# Patient Record
Sex: Male | Born: 1960 | Race: White | Hispanic: No | Smoking: Current every day smoker
Health system: Southern US, Community
[De-identification: ages and names within clinical notes are randomized; demographics above are authoritative.]

## PROBLEM LIST (undated history)

## (undated) DIAGNOSIS — Z7982 Long term (current) use of aspirin: Secondary | ICD-10-CM

## (undated) DIAGNOSIS — J45909 Unspecified asthma, uncomplicated: Secondary | ICD-10-CM

## (undated) DIAGNOSIS — F1010 Alcohol abuse, uncomplicated: Secondary | ICD-10-CM

## (undated) DIAGNOSIS — Z7902 Long term (current) use of antithrombotics/antiplatelets: Secondary | ICD-10-CM

## (undated) DIAGNOSIS — J449 Chronic obstructive pulmonary disease, unspecified: Secondary | ICD-10-CM

## (undated) DIAGNOSIS — I700 Atherosclerosis of aorta: Secondary | ICD-10-CM

## (undated) DIAGNOSIS — R0609 Other forms of dyspnea: Secondary | ICD-10-CM

## (undated) DIAGNOSIS — K219 Gastro-esophageal reflux disease without esophagitis: Secondary | ICD-10-CM

## (undated) DIAGNOSIS — I70219 Atherosclerosis of native arteries of extremities with intermittent claudication, unspecified extremity: Secondary | ICD-10-CM

## (undated) DIAGNOSIS — Z720 Tobacco use: Secondary | ICD-10-CM

## (undated) DIAGNOSIS — M47817 Spondylosis without myelopathy or radiculopathy, lumbosacral region: Secondary | ICD-10-CM

## (undated) DIAGNOSIS — Z955 Presence of coronary angioplasty implant and graft: Secondary | ICD-10-CM

## (undated) DIAGNOSIS — M1990 Unspecified osteoarthritis, unspecified site: Secondary | ICD-10-CM

## (undated) DIAGNOSIS — I2510 Atherosclerotic heart disease of native coronary artery without angina pectoris: Secondary | ICD-10-CM

## (undated) DIAGNOSIS — R918 Other nonspecific abnormal finding of lung field: Secondary | ICD-10-CM

## (undated) DIAGNOSIS — I10 Essential (primary) hypertension: Secondary | ICD-10-CM

## (undated) DIAGNOSIS — E785 Hyperlipidemia, unspecified: Secondary | ICD-10-CM

## (undated) HISTORY — PX: NO PAST SURGERIES: SHX2092

## (undated) HISTORY — DX: Chronic obstructive pulmonary disease, unspecified: J44.9

## (undated) HISTORY — DX: Essential (primary) hypertension: I10

## (undated) HISTORY — DX: Unspecified osteoarthritis, unspecified site: M19.90

---

## 2015-10-29 ENCOUNTER — Encounter (HOSPITAL_COMMUNITY): Payer: Self-pay | Admitting: *Deleted

## 2015-10-29 ENCOUNTER — Emergency Department (HOSPITAL_COMMUNITY): Payer: Self-pay

## 2015-10-29 ENCOUNTER — Emergency Department (HOSPITAL_COMMUNITY)
Admission: EM | Admit: 2015-10-29 | Discharge: 2015-10-29 | Disposition: A | Discharge status: Home / Self Care | Payer: Self-pay | Attending: Emergency Medicine | Admitting: Emergency Medicine

## 2015-10-29 DIAGNOSIS — F17210 Nicotine dependence, cigarettes, uncomplicated: Secondary | ICD-10-CM | POA: Insufficient documentation

## 2015-10-29 DIAGNOSIS — Z792 Long term (current) use of antibiotics: Secondary | ICD-10-CM | POA: Insufficient documentation

## 2015-10-29 DIAGNOSIS — R197 Diarrhea, unspecified: Secondary | ICD-10-CM | POA: Insufficient documentation

## 2015-10-29 DIAGNOSIS — J209 Acute bronchitis, unspecified: Secondary | ICD-10-CM | POA: Insufficient documentation

## 2015-10-29 DIAGNOSIS — R509 Fever, unspecified: Secondary | ICD-10-CM | POA: Insufficient documentation

## 2015-10-29 MED ORDER — ALBUTEROL SULFATE (2.5 MG/3ML) 0.083% IN NEBU
5.0000 mg | INHALATION_SOLUTION | Freq: Once | RESPIRATORY_TRACT | Status: AC
  Administered 2015-10-29: 5 mg via RESPIRATORY_TRACT
  Filled 2015-10-29: qty 6

## 2015-10-29 MED ORDER — IPRATROPIUM-ALBUTEROL 0.5-2.5 (3) MG/3ML IN SOLN
3.0000 mL | Freq: Once | RESPIRATORY_TRACT | Status: AC
  Administered 2015-10-29: 3 mL via RESPIRATORY_TRACT
  Filled 2015-10-29: qty 3

## 2015-10-29 MED ORDER — PREDNISONE 20 MG PO TABS: 40.0000 mg | ORAL_TABLET | Freq: Every day | ORAL | Status: DC

## 2015-10-29 MED ORDER — AZITHROMYCIN 250 MG PO TABS
250.0000 mg | ORAL_TABLET | Freq: Every day | ORAL | Status: DC
Start: 2015-10-29 — End: 2021-10-24

## 2015-10-29 MED ORDER — ALBUTEROL SULFATE HFA 108 (90 BASE) MCG/ACT IN AERS: 1.0000 | INHALATION_SPRAY | Freq: Four times a day (QID) | RESPIRATORY_TRACT | Status: DC | PRN

## 2015-10-29 MED ORDER — PREDNISONE 20 MG PO TABS
60.0000 mg | ORAL_TABLET | Freq: Once | ORAL | Status: AC
  Administered 2015-10-29: 60 mg via ORAL
  Filled 2015-10-29: qty 3

## 2015-10-29 NOTE — ED Notes (Signed)
Pt c/o productive cough onset x 1 wk, with yellow & green sputum, fever, chills, pt c/o SOB worse with ambulation, pt denies CP, n/v, pt reports x 2 episodes of diarrhea in the last 24 hrs

## 2015-10-29 NOTE — ED Provider Notes (Signed)
CSN: [id number]     Arrival date & time 10/29/15  1221 History   First MD Initiated Contact with Patient 10/29/15 1332     Chief Complaint  Patient presents with  . Cough  . Shortness of Breath     (Consider location/radiation/quality/duration/timing/severity/associated sxs/prior Treatment) Patient is a 55 y.o. male presenting with cough and shortness of breath. The history is provided by the patient and medical records. No language interpreter was used.  Cough Associated symptoms: chills, fever, shortness of breath and wheezing   Associated symptoms: no diaphoresis, no headaches, no myalgias, no rash, no rhinorrhea and no sore throat   Shortness of Breath Associated symptoms: cough, fever and wheezing   Associated symptoms: no abdominal pain, no diaphoresis, no headaches, no neck pain, no rash, no sore throat and no vomiting     Dominic Ferguson is a 55 y.o. male  who presents to the Emergency Department complaining of new onset productive cough with yellow sputum and shortness of breath x 3 days. - sob worse with exertion. + subjective fevers at home. Denies chest pain. Denies hx of lung disease. + smoker "a pack or so a day". Does not use oxygen at home. No medications taken PTA.   History reviewed. No pertinent past medical history. History reviewed. No pertinent past surgical history. No family history on file. Social History  Substance Use Topics  . Smoking status: Current Every Day Smoker -- 1.00 packs/day    Types: Cigarettes  . Smokeless tobacco: None  . Alcohol Use: 29.4 oz/week    49 Cans of beer per week    Review of Systems  Constitutional: Positive for fever and chills. Negative for diaphoresis and appetite change.  HENT: Negative for congestion, rhinorrhea and sore throat.   Eyes: Negative for visual disturbance.  Respiratory: Positive for cough, shortness of breath and wheezing.   Cardiovascular: Negative.   Gastrointestinal: Positive for diarrhea. Negative for  nausea, vomiting, abdominal pain and constipation.  Musculoskeletal: Negative for myalgias, back pain, arthralgias and neck pain.  Skin: Negative for rash.  Allergic/Immunologic: Negative for immunocompromised state.  Neurological: Negative for dizziness, weakness and headaches.      Allergies  Review of patient's allergies indicates no known allergies.  Home Medications   Prior to Admission medications   Medication Sig Start Date End Date Taking? Authorizing Provider  albuterol (PROVENTIL HFA;VENTOLIN HFA) 108 (90 Base) MCG/ACT inhaler Inhale 1-2 puffs into the lungs every 6 (six) hours as needed for wheezing or shortness of breath. 10/29/15   Saliha Salts Pilcher Jovi Zavadil, PA-C  aspirin-sod bicarb-citric acid (ALKA-SELTZER) 325 MG TBEF tablet Take 325 mg by mouth every 6 (six) hours as needed (cough).   Yes Historical Provider, MD  azithromycin (ZITHROMAX) 250 MG tablet Take 1 tablet (250 mg total) by mouth daily. Take first 2 tablets together, then 1 every day until finished. 10/29/15   Barlow Harrison Pilcher Itay Mella, PA-C  Phenyleph-Doxylamine-DM-APAP (NYQUIL SEVERE COLD/FLU) 5-6.25-10-325 MG/15ML LIQD Take 10 mLs by mouth every 6 (six) hours as needed (cough).   Yes Historical Provider, MD  predniSONE (DELTASONE) 20 MG tablet Take 2 tablets (40 mg total) by mouth daily. 10/29/15   Becca Bayne Pilcher Wadie Mattie, PA-C   BP 122/81 mmHg  Pulse 88  Temp(Src) 97.4 F (36.3 C) (Oral)  Resp 21  Ht 5\' 8"  (1.727 m)  Wt 79.379 kg  BMI 26.61 kg/m2  SpO2 90% Physical Exam  Constitutional: He is oriented to person, place, and time. He appears well-developed and well-nourished.  Alert and  in no acute distress  HENT:  Head: Normocephalic and atraumatic.  Cardiovascular: Normal rate, regular rhythm, normal heart sounds and intact distal pulses.  Exam reveals no gallop and no friction rub.   No murmur heard. Pulmonary/Chest: No respiratory distress. He exhibits no tenderness.  Bilateral expiratory wheezing, prolonged  expiratory phase  Abdominal: He exhibits no mass. There is no rebound and no guarding.  Abdomen soft, non-tender, non-distended  Musculoskeletal: He exhibits no edema.  Neurological: He is alert and oriented to person, place, and time.  Skin: Skin is warm and dry. No rash noted.  Psychiatric: He has a normal mood and affect. His behavior is normal. Judgment and thought content normal.  Nursing note and vitals reviewed.   ED Course  Procedures (including critical care time) Labs Review Labs Reviewed - No data to display  Imaging Review Dg Chest 2 View  10/29/2015  CLINICAL DATA:  Shortness of breath 1 week with cough and increased congestion past 3-4 days. Fever. EXAM: CHEST  2 VIEW COMPARISON:  None. FINDINGS: Lungs are adequately inflated without focal consolidation or effusion. There is mild prominence of the central bronchovascular markings. Flattening of the hemidiaphragms on the lateral film. Cardiomediastinal silhouette is within normal. There is calcified plaque over the thoracic aorta. There is mild degenerate change of the spine. IMPRESSION: Minimal prominence of the central bronchovascular markings which may be due to an acute viral bronchitic process versus chronic smoking related changes. Electronically Signed   By: Elberta Fortis M.D.   On: 10/29/2015 14:45   I have personally reviewed and evaluated these images and lab results as part of my medical decision-making.   EKG Interpretation   Date/Time:  Saturday October 29 2015 12:40:33 EST Ventricular Rate:  92 PR Interval:  150 QRS Duration: 96 QT Interval:  354 QTC Calculation: 437 R Axis:   72 Text Interpretation:  Normal sinus rhythm Right atrial enlargement No  previous tracing Confirmed by Anitra Lauth  MD, Alphonzo Lemmings ([id number]) on 10/29/2015  1:35:11 PM      MDM   Final diagnoses:  Acute bronchitis, unspecified organism   Dominic Ferguson presents with new onset productive cough, shortness of breath x 3 days. No hx of lung  disease, but 1ppd smoker   Imaging: CXR shows no consolidation or effusion Therapeutics:Alb. Neb tx, duoneb tx x 2, 60mg  prednisone   3:33 PM - Patient re-evaluated after 2nd neb treatment, lungs improved, but still wheezing. Patient requesting to leave. Had discussion at length that one more breathing treatment would be very beneficial. Patient agrees to stay for one more duoneb.   4:28 PM - Patient re-evaluated, feels much improved. Return precautions given.   A&P: Acute bronchitis  - Quit smoking  - Get PCP and follow up (resource guide given)  - Albuterol inhaler PRN cough, shortness of breath  - Azithromycin  - Prednisone steroid burst  Patient discussed with Dr. Anitra Lauth who agrees with treatment plan.  [facility] November Sypher, PA-C 10/29/15 1629  Gwyneth Sprout, MD 10/30/15 [phone]

## 2015-10-29 NOTE — Discharge Instructions (Signed)
1. Medications: albuterol inhaler as needed, azithromycin is your antibiotic - Please take all of your antibiotics until finished!, prednisone is your steroid, continue usual home medications 2. Treatment: rest, drink plenty of fluids 3. Follow Up: Please follow up with your primary doctor in 2-3 days for discussion of your diagnoses and further evaluation after today's visit; if you do not have a primary care doctor use the resource guide provided to find one; Please return to the ER for chest pain, shortness of breath, any new or worsening conditions, any additional concerns.    Emergency Department Resource Guide 1) Find a Doctor and Pay Out of Pocket Although you won't have to find out who is covered by your insurance plan, it is a good idea to ask around and get recommendations. You will then need to call the office and see if the doctor you have chosen will accept you as a new patient and what types of options they offer for patients who are self-pay. Some doctors offer discounts or will set up payment plans for their patients who do not have insurance, but you will need to ask so you aren't surprised when you get to your appointment.  2) Contact Your Local Health Department Not all health departments have doctors that can see patients for sick visits, but many do, so it is worth a call to see if yours does. If you don't know where your local health department is, you can check in your phone book. The CDC also has a tool to help you locate your state's health department, and many state websites also have listings of all of their local health departments.  3) Find a Walk-in Clinic If your illness is not likely to be very severe or complicated, you may want to try a walk in clinic. These are popping up all over the country in pharmacies, drugstores, and shopping centers. They're usually staffed by nurse practitioners or physician assistants that have been trained to treat common illnesses and  complaints. They're usually fairly quick and inexpensive. However, if you have serious medical issues or chronic medical problems, these are probably not your best option.  No Primary Care Doctor: - Call Health Connect at  [phone] - they can help you locate a primary care doctor that  accepts your insurance, provides certain services, etc. - Physician Referral Service- [phone]  Chronic Pain Problems: Organization         Address  Phone   Notes  [facility]  [phone] Patients need to be referred by their primary care doctor.   Medication Assistance: Organization         Address  Phone   Notes  [facility]ication [facility] [address] [place], [place] [address] [phone] --Must be a resident of [place] -- Must have NO insurance coverage whatsoever (no Medicaid/ Medicare, etc.) -- The pt. MUST have a primary care doctor that directs their care regularly and follows them in the community   MedAssist  [phone]   [employer]  [phone]    Agencies that provide inexpensive medical care: Organization         Address  Phone   Notes  [facility]  [phone]   [facility]    [phone]   [facility] [address] [place], [place] [address] [phone]   [facility] [place] [address], [place] [phone]  Planned Parenthood    [phone]   [facility]    [phone]   Community Health and [place] [address], [place] Phone:  [phone], Fax:  [phone] Hours of Operation:  9 am - 6 pm, M-F.  Also accepts Medicaid/Medicare and self-pay.  [facility] for [place], [address], [place] Phone: [phone], Fax: [phone]. Hours of Operation:  8:30 am - 5:30 pm, M-F.  Also accepts Medicaid and self-pay.  [facility] [place] [address], [place] Phone: [phone]   [place], [place], [place] [phone], Ext. [phone] Mondays & Thursdays: 7-9 AM.  First 15 patients are seen on a first come, first serve basis.    [place] Providers:  Organization         Address  Phone   Notes  [facility] [address], [place] [phone] Also accepts self-pay patients.  [facility] [address], [place]  [phone]   [place], [address], [place] [phone]   [facility] [address] [place] [phone]   Lucianne Lei [address], [place]   [phone] Only accepts [place] Access [place] patients after they have their name applied to their card.   Self-Pay (no insurance) in [facility]:  Organization         Address  Phone   Notes  Sickle Cell Patients, [facility] [place] [phone]   [facility] [place] [phone]   [facility] [place]  [place], [place], [place] [phone]   [facility]/Dr. Osei-Bonsu  [address], [place] or [address], [place] [phone] Phone number for both [place] and [place] locations is the same.  Urgent Medical and [facility] [address], [place] [phone]   [facility] [address], [place] or [address] [phone]   [facility] [address] [place] [phone], phone; [phone], fax Sees patients 1st and 3rd Saturday of every month.  Must not qualify for public or private insurance (i.e. Medicaid, Medicare, [place] Health Choice, Veterans' Benefits)  Household income should be no more than 200% of the poverty level  The clinic cannot treat you if you are pregnant or think you are pregnant  Sexually transmitted diseases are not treated at the clinic.    Dental Care: Organization         Address  Phone  Notes  [facility] Department of [facility] [phone] Accepts children up to age 26 who are enrolled in [place] or [place]; pregnant women with a Medicaid card; and children who have applied for Medicaid or [place] Health Choice, but were declined, whose parents can pay a reduced fee at time of service.  [facility] Department of [place] [facility]  [address], [place] [phone] Accepts children up to age 44 who are enrolled in [place] or [place]; pregnant women with a Medicaid card; and children who have applied for Medicaid or [place], but were declined,  whose parents can pay a reduced fee at time of service.  [facility] Adult Dental Access PROGRAM  [place] [phone] Patients are seen by appointment only. Walk-ins are not accepted. [place] will see patients 21 years of age and older. Monday - Tuesday (8am-5pm) Most Wednesdays (8:30-5pm) $30 per visit, cash only  [facility] Adult Dental Access PROGRAM  [address], [facility] [phone] Patients are seen by appointment only. Walk-ins are not accepted. [place] will see patients 70 years of age and older. One Wednesday Evening (Monthly: Volunteer Based).  $30 per visit, cash only  [place]  [phone] for adults; Children under age 28, call Graduate Pediatric Dentistry at [phone]. Children aged 8-14, please call [phone] to request a pediatric application.  Dental services are provided in all areas of dental care including fillings, crowns and bridges, complete and partial dentures, implants, gum treatment, root canals, and extractions. Preventive care is  also provided. Treatment is provided to both adults and children. Patients are selected via a lottery and there is often a waiting list.   [facility] [address] [place]  [phone] [email or web address]   [facility] [address] [place], [place] [phone], Ext. [phone] Second and Fourth Thursday of each month, opens at 6:30 AM; Clinic ends at 9 AM.  Patients are seen on a first-come first-served basis, and a limited number are seen during each clinic.   [facility]  [address] Hillard Danker [place], [place] [phone]   Eligibility Requirements You must have lived in [place], [place], or [place] counties for at least the last three months.   You cannot be eligible for state or federal sponsored [employer], including [employer], [place], or [employer].   You generally cannot be eligible for healthcare insurance through your employer.    How to apply: Eligibility screenings are held every Tuesday and Wednesday afternoon from 1:00 pm until 4:00 pm. You do not need an appointment for the interview!  [facility] [address], [place], [place]   [place]  [place] Department  [place]  [phone]    Behavioral Health Resources in the Community: Intensive Outpatient Programs Organization         Address  Phone  Notes  [place]. [address], [place], [place] [phone]   [facility] Outpatient [address] [place], [place]   ADS: Alcohol & Drug Svcs [address] [place], [place]   [place] [address]  [place], [place] or [phone]   Substance Abuse Resources Organization         Address  Phone  Notes  Alcohol and Drug Services  [phone]   [place]  [phone]   [facility] [place]   Chinita Pester  [phone]   Residential & Outpatient Substance Abuse Program  [phone]   Psychological Services Organization         Address  Phone  Notes  [facility] [place]  [place]  [phone]   [place] [address] [place] or [phone]    Mobile Crisis Teams Organization         Address  Phone  Notes  Therapeutic Alternatives, Mobile  Crisis Care Unit  [phone]   Assertive Psychotherapeutic Services  [address] [place], [place]   [facility] [address], [place] [phone]    Self-Help/Support Groups Organization         Address  Phone             Notes  [place]. of [place] - variety of support groups  [place] Call for more information  Narcotics Anonymous (NA), [facility]aring [facility] [address], [employer] [place]  2 meetings at this location   Special educational needs teacher         Address  Phone  Notes  ASAP Residential Treatment [place],    [place]  [phone]   [facility]  [address], [place] [address]191, [place], [place]   [place], [place] [phone] Admissions: 8am-3pm M-F  Incentives Substance [place] [address]    [place], [place] [phone]   [facility] [address] [place], [place], [place]   [facility] [address]  [place], [place]   [facility]ight Programs - Intensive Outpatient [address], [place], [place]   [facility].,  [place], [place] [phone] or [phone]   [facility]idential Treatment Services (RTS) [address] [place], [place] Accepts Medicaid  [facility] [address],  [place] [phone]  Substance Abuse/Addiction Treatment   [address] [facility] Organization         Address  Phone  Notes  [facility]  [phone]   Domenic Schwab, PhD [address] Arlis Porta [place], [place]   [phone] or [phone]   [place], [place] [phone]   [facility] [address], [place], [place] [phone] Insurance/Medicaid/sponsorship through [facility] and Families [address] [place]                                    [place], [place] [phone] [place] [address], [place] [phone]    Dr. Adele Schilder  [phone]   [facility] [place] Dept. 1) [address] [place] 2) [place] 3)  [place] [address], [place] [phone]  [phone]   [place] [phone] or [phone] (After Hours)

## 2015-10-29 NOTE — ED Notes (Signed)
Pt verbalized understanding of d/c instructions, prescriptions, and follow-up care. No further questions/concerns, VSS, ambulatory w/ steady gait (refused wheelchair) 

## 2016-11-30 IMAGING — DX DG CHEST 2V
2 series · 2 of 2 positions shown · non-contrast
Comparison: None.

CLINICAL DATA: Shortness of breath 1 week with cough and increased
congestion past 3-4 days. Fever.

EXAM:
CHEST  2 VIEW

[chest pa]
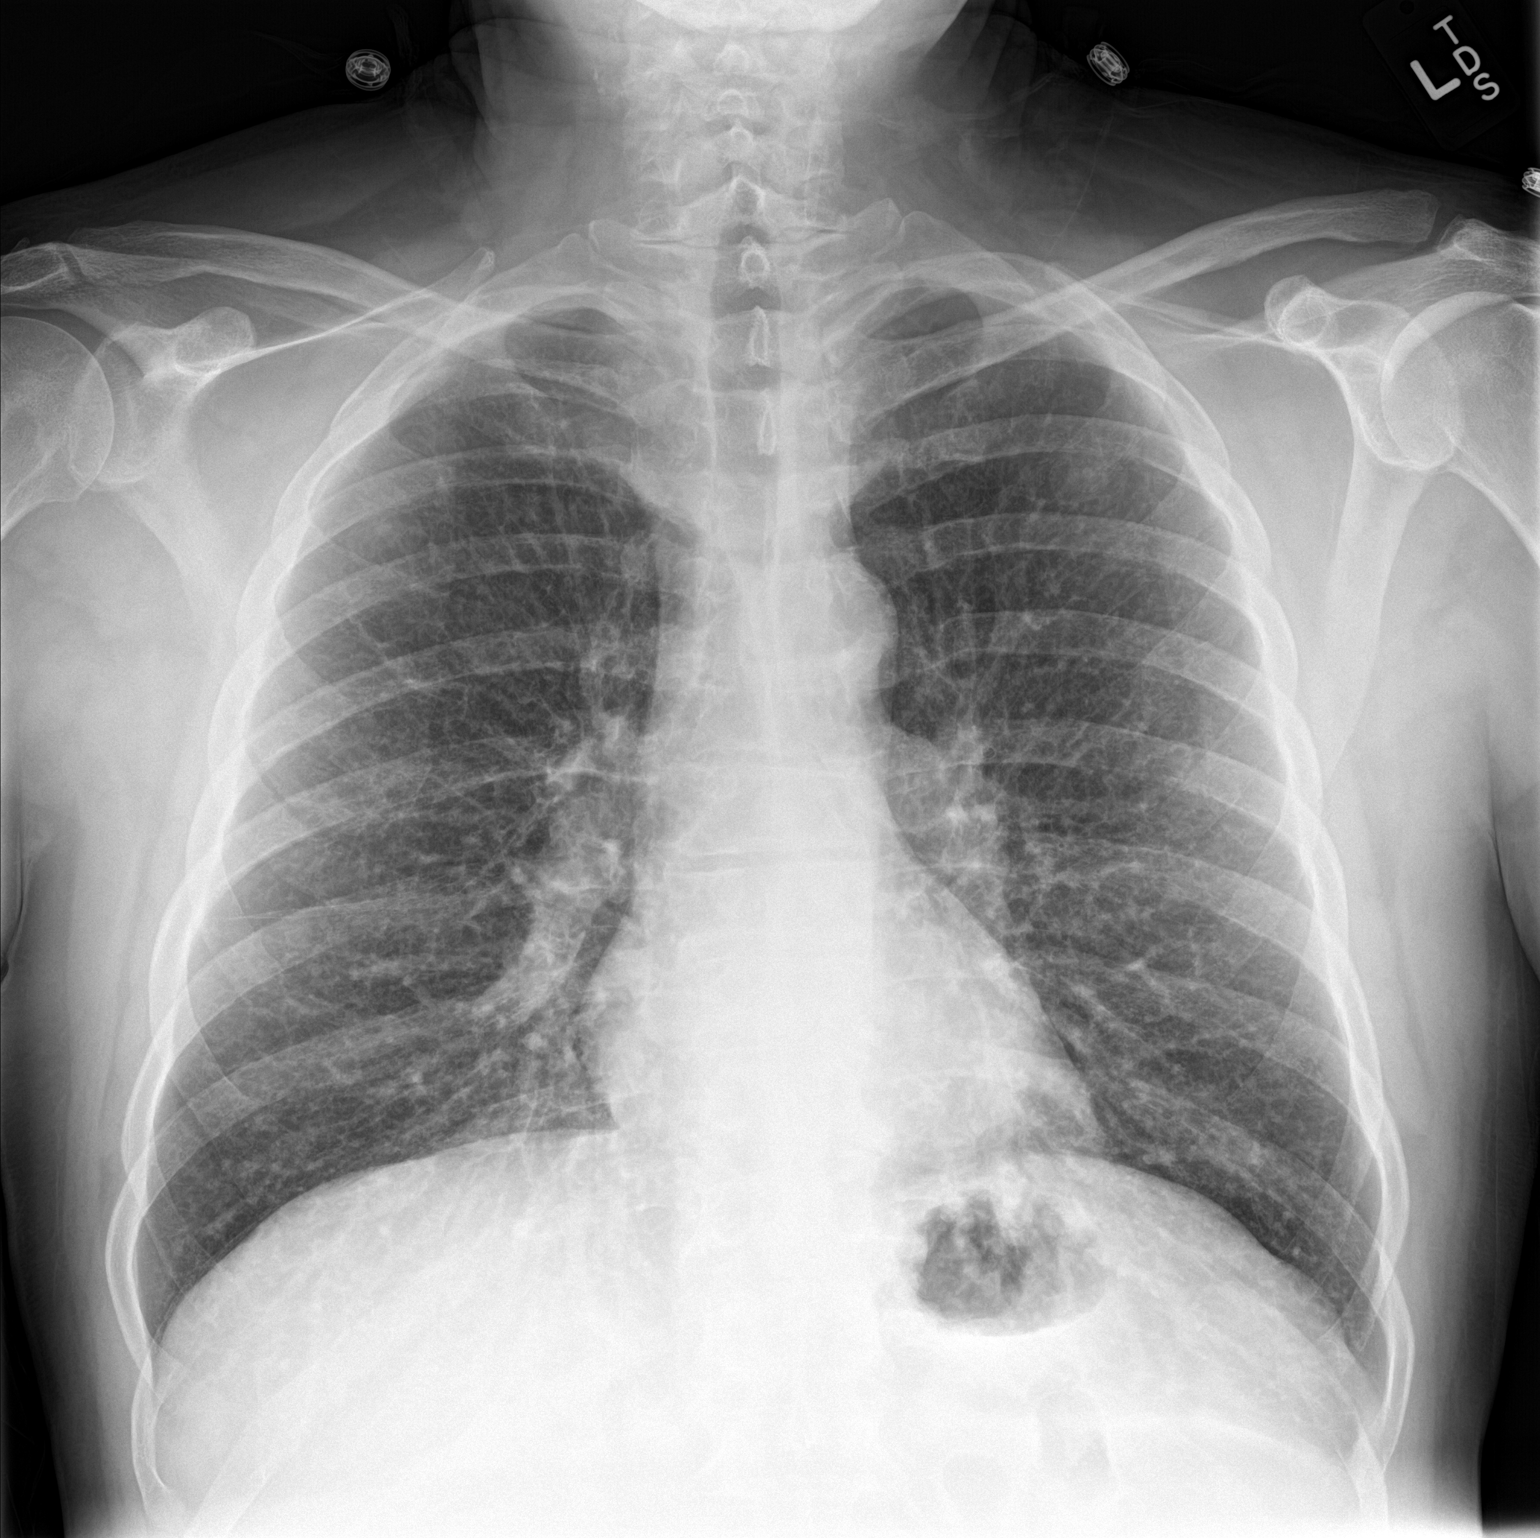

[chest lat]
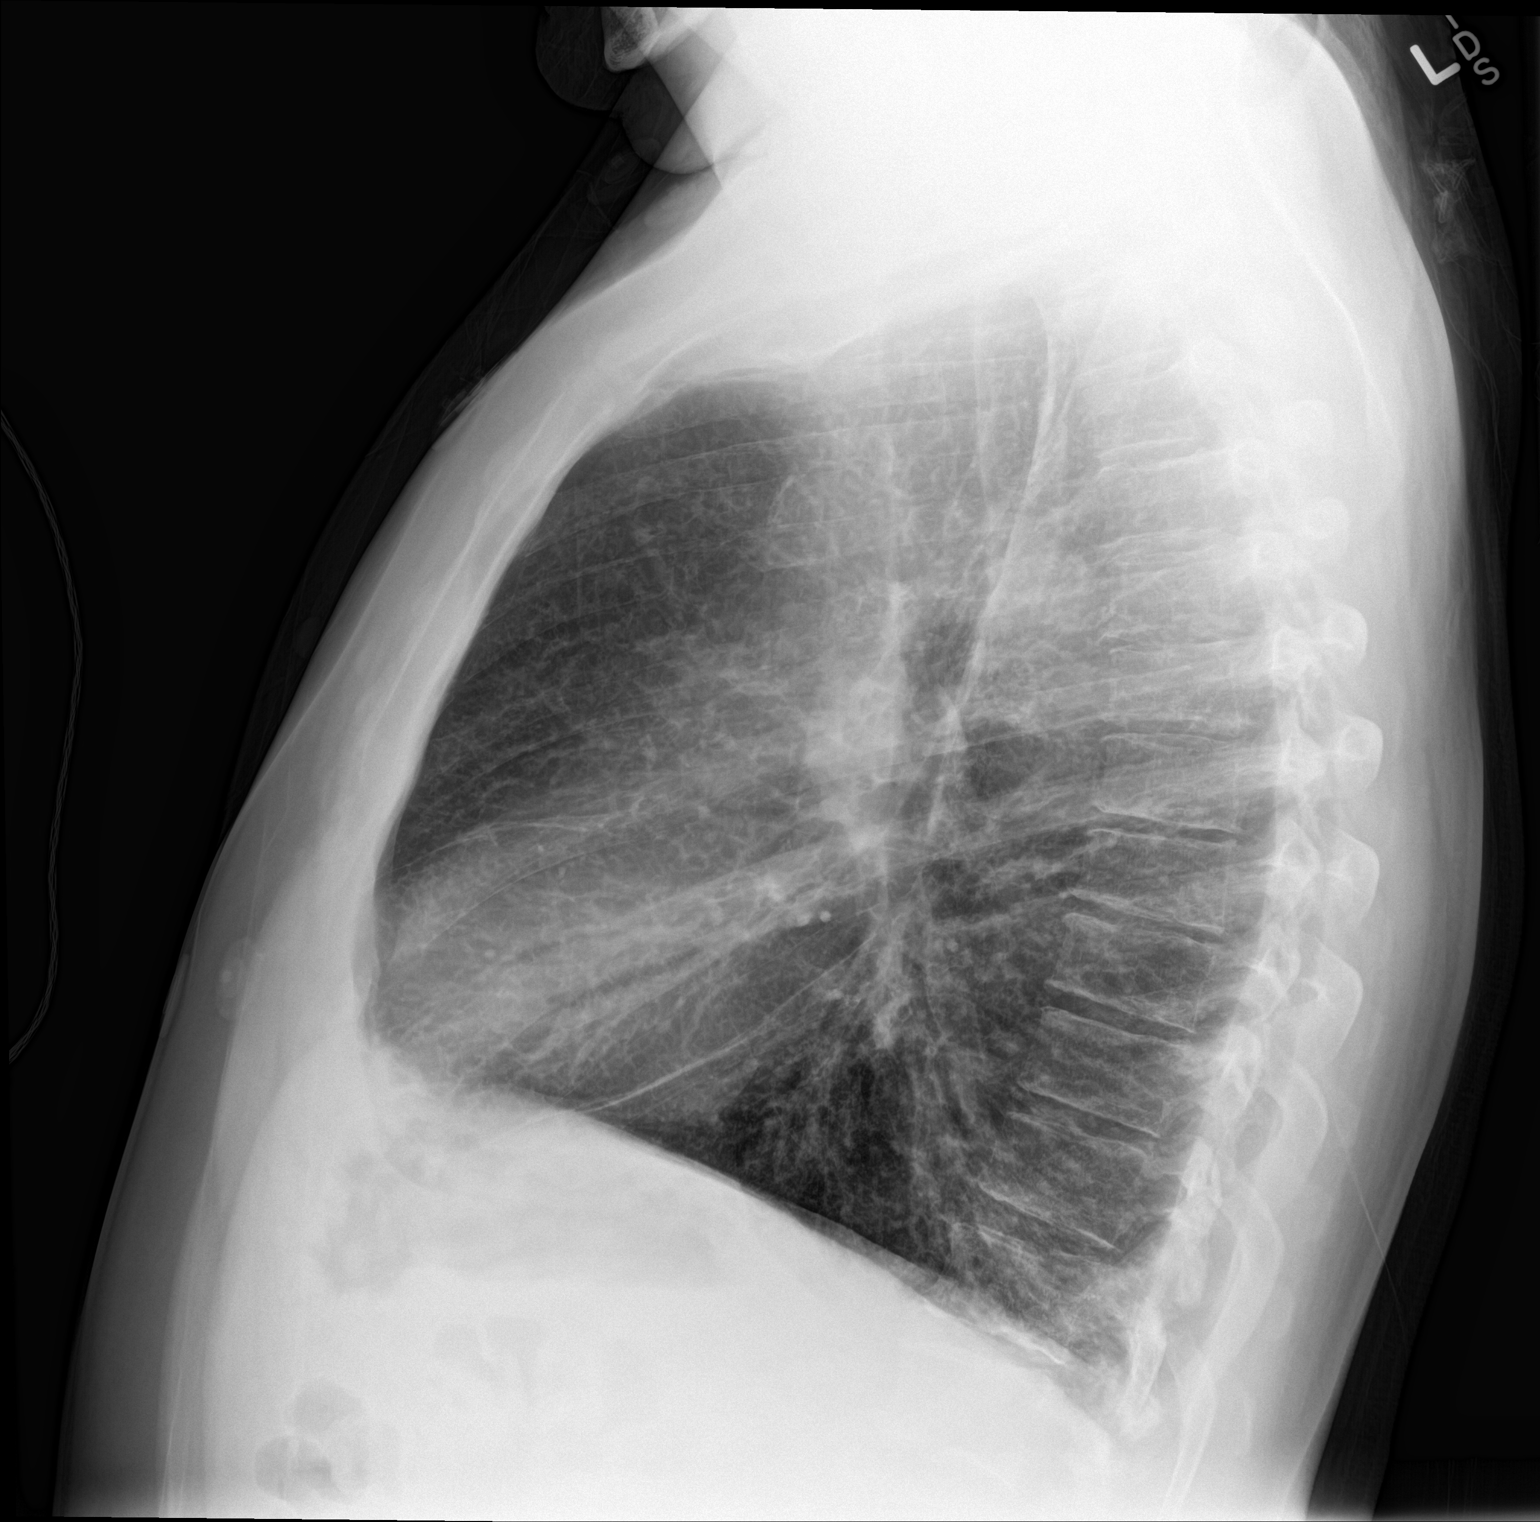

[2 of 2 positions shown; findings below may reference images not displayed]

FINDINGS: Lungs are adequately inflated without focal consolidation or
effusion. There is mild prominence of the central bronchovascular
markings. Flattening of the hemidiaphragms on the lateral film.
Cardiomediastinal silhouette is within normal. There is calcified
plaque over the thoracic aorta. There is mild degenerate change of
the spine.
IMPRESSION: Minimal prominence of the central bronchovascular markings which may
be due to an acute viral bronchitic process versus chronic smoking
related changes.

## 2018-04-07 ENCOUNTER — Emergency Department (HOSPITAL_COMMUNITY)
Admission: EM | Admit: 2018-04-07 | Discharge: 2018-04-07 | Disposition: A | Discharge status: Home / Self Care | Payer: Self-pay | Attending: Emergency Medicine | Admitting: Emergency Medicine

## 2018-04-07 ENCOUNTER — Encounter (HOSPITAL_COMMUNITY): Payer: Self-pay | Admitting: Emergency Medicine

## 2018-04-07 ENCOUNTER — Other Ambulatory Visit: Payer: Self-pay

## 2018-04-07 DIAGNOSIS — M545 Low back pain, unspecified: Secondary | ICD-10-CM

## 2018-04-07 DIAGNOSIS — Z79899 Other long term (current) drug therapy: Secondary | ICD-10-CM | POA: Insufficient documentation

## 2018-04-07 DIAGNOSIS — F17210 Nicotine dependence, cigarettes, uncomplicated: Secondary | ICD-10-CM | POA: Insufficient documentation

## 2018-04-07 MED ORDER — ACETAMINOPHEN 500 MG PO TABS
1000.0000 mg | ORAL_TABLET | Freq: Once | ORAL | Status: AC
  Administered 2018-04-07: 1000 mg via ORAL
  Filled 2018-04-07: qty 2

## 2018-04-07 MED ORDER — KETOROLAC TROMETHAMINE 60 MG/2ML IM SOLN
15.0000 mg | Freq: Once | INTRAMUSCULAR | Status: AC
  Administered 2018-04-07: 15 mg via INTRAMUSCULAR
  Filled 2018-04-07: qty 2

## 2018-04-07 MED ORDER — OXYCODONE HCL 5 MG PO TABS
5.0000 mg | ORAL_TABLET | Freq: Once | ORAL | Status: AC
  Administered 2018-04-07: 5 mg via ORAL
  Filled 2018-04-07: qty 1

## 2018-04-07 MED ORDER — DIAZEPAM 2 MG PO TABS
2.0000 mg | ORAL_TABLET | Freq: Once | ORAL | Status: DC
Start: 2018-04-07 — End: 2018-04-08
  Filled 2018-04-07: qty 1

## 2018-04-07 NOTE — Discharge Instructions (Signed)
Take 4 over the counter ibuprofen tablets 3 times a day or 2 over-the-counter naproxen tablets twice a day for pain. Also take tylenol 1000mg(2 extra strength) four times a day.    

## 2018-04-07 NOTE — ED Triage Notes (Signed)
Patient to ED c/o lower back pain x 6 days - unknown if certain injury or strenuous work caused it. Reports pain is worse with movement. Denies urinary symptoms or incontinence. No numbness/tingling. Ambulatory with steady gait, full ROM all extremities.

## 2018-04-07 NOTE — ED Provider Notes (Signed)
[facility] EMERGENCY DEPARTMENT Provider Note   CSN: [id number] Arrival date & time: 04/07/18  1805     History   Chief Complaint Chief Complaint  Patient presents with  . Back Pain    HPI Dominic Ferguson is a 57 y.o. male.  57 yo M with L sided low back pain.  Going on for past two days.  Thought kidney infection but not improvement with cranberry juice.  Worse with positions. Denies leg weakness, numbness, loss bowel or bladder.    The history is provided by the patient.  Back Pain   This is a new problem. The current episode started 2 days ago. The problem occurs constantly. The problem has been gradually worsening. The pain is present in the lumbar spine. The quality of the pain is described as stabbing and shooting. The pain does not radiate. The pain is at a severity of 7/10. The pain is moderate. The symptoms are aggravated by bending and twisting. Pertinent negatives include no chest pain, no fever, no headaches and no abdominal pain. He has tried bed rest for the symptoms. The treatment provided no relief.    History reviewed. No pertinent past medical history.  There are no active problems to display for this patient.   History reviewed. No pertinent surgical history.      Home Medications    Prior to Admission medications   Medication Sig Start Date End Date Taking? Authorizing Provider  albuterol (PROVENTIL HFA;VENTOLIN HFA) 108 (90 Base) MCG/ACT inhaler Inhale 1-2 puffs into the lungs every 6 (six) hours as needed for wheezing or shortness of breath. Patient not taking: Reported on 04/07/2018 10/29/15   Ward, Chase Picket, PA-C  azithromycin (ZITHROMAX) 250 MG tablet Take 1 tablet (250 mg total) by mouth daily. Take first 2 tablets together, then 1 every day until finished. Patient not taking: Reported on 04/07/2018 10/29/15   Ward, Chase Picket, PA-C  predniSONE (DELTASONE) 20 MG tablet Take 2 tablets (40 mg total) by mouth daily. Patient not  taking: Reported on 04/07/2018 10/29/15   Ward, Chase Picket, PA-C    Family History No family history on file.  Social History Social History   Tobacco Use  . Smoking status: Current Every Day Smoker    Packs/day: 1.00    Types: Cigarettes  . Smokeless tobacco: Never Used  Substance Use Topics  . Alcohol use: Yes    Alcohol/week: 29.4 oz    Types: 49 Cans of beer per week  . Drug use: Never     Allergies   Patient has no known allergies.   Review of Systems Review of Systems  Constitutional: Negative for chills and fever.  HENT: Negative for congestion and facial swelling.   Eyes: Negative for discharge and visual disturbance.  Respiratory: Negative for shortness of breath.   Cardiovascular: Negative for chest pain and palpitations.  Gastrointestinal: Negative for abdominal pain, diarrhea and vomiting.  Musculoskeletal: Positive for back pain. Negative for arthralgias and myalgias.  Skin: Negative for color change and rash.  Neurological: Negative for tremors, syncope and headaches.  Psychiatric/Behavioral: Negative for confusion and dysphoric mood.     Physical Exam Updated Vital Signs BP (!) 159/88 (BP Location: Right Arm)   Pulse 80   Temp 98.1 F (36.7 C) (Oral)   Resp 17   Ht 5\' 8"  (1.727 m)   Wt 80.3 kg (177 lb)   SpO2 98%   BMI 26.91 kg/m   Physical Exam  Constitutional: He is oriented  to person, place, and time. He appears well-developed and well-nourished.  HENT:  Head: Normocephalic and atraumatic.  Eyes: Pupils are equal, round, and reactive to light. EOM are normal.  Neck: Normal range of motion. Neck supple. No JVD present.  Cardiovascular: Normal rate and regular rhythm. Exam reveals no gallop and no friction rub.  No murmur heard. Pulmonary/Chest: No respiratory distress. He has no wheezes.  Abdominal: He exhibits no distension and no mass. There is no tenderness. There is no rebound and no guarding.  Musculoskeletal: Normal range of  motion. He exhibits tenderness.  Mild tenderness to palpation about the left paraspinal musculature about L3-4-5.  Ambulatory.  Pulse motor and sensation intact distally.  Neurological: He is alert and oriented to person, place, and time.  Skin: No rash noted. No pallor.  Psychiatric: He has a normal mood and affect. His behavior is normal.  Nursing note and vitals reviewed.    ED Treatments / Results  Labs (all labs ordered are listed, but only abnormal results are displayed) Labs Reviewed - No data to display  EKG None  Radiology No results found.  Procedures Procedures (including critical care time)  Medications Ordered in ED Medications  diazepam (VALIUM) tablet 2 mg (0 mg Oral Hold 04/07/18 2201)  acetaminophen (TYLENOL) tablet 1,000 mg (1,000 mg Oral Given 04/07/18 2150)  ketorolac (TORADOL) injection 15 mg (15 mg Intramuscular Given 04/07/18 2150)  oxyCODONE (Oxy IR/ROXICODONE) immediate release tablet 5 mg (5 mg Oral Given 04/07/18 2150)     Initial Impression / Assessment and Plan / ED Course  I have reviewed the triage vital signs and the nursing notes.  Pertinent labs & imaging results that were available during my care of the patient were reviewed by me and considered in my medical decision making (see chart for details).     57 yo M with a cc of L sided low back pain.  Likely muscular skeletal by history and physical.  Patient has no red flags.  Do not feel that imaging is warranted.  Feels better after symptomatic therapy here.  Tylenol and NSAIDs at home.  PCP follow-up.  10:35 PM:  I have discussed the diagnosis/risks/treatment options with the patient and believe the pt to be eligible for discharge home to follow-up with PCP. We also discussed returning to the ED immediately if new or worsening sx occur. We discussed the sx which are most concerning (e.g., cauda equina s/sx) that necessitate immediate return. Medications administered to the patient during their  visit and any new prescriptions provided to the patient are listed below.  Medications given during this visit Medications  diazepam (VALIUM) tablet 2 mg (0 mg Oral Hold 04/07/18 2201)  acetaminophen (TYLENOL) tablet 1,000 mg (1,000 mg Oral Given 04/07/18 2150)  ketorolac (TORADOL) injection 15 mg (15 mg Intramuscular Given 04/07/18 2150)  oxyCODONE (Oxy IR/ROXICODONE) immediate release tablet 5 mg (5 mg Oral Given 04/07/18 2150)      The patient appears reasonably screen and/or stabilized for discharge and I doubt any other medical condition or other [facility] requiring further screening, evaluation, or treatment in the ED at this time prior to discharge.    Final Clinical Impressions(s) / ED Diagnoses   Final diagnoses:  Acute left-sided low back pain without sciatica    ED Discharge Orders    None       Melene PlanFloyd, Kaitland Lewellyn, DO 04/07/18 2235

## 2018-04-07 NOTE — ED Notes (Signed)
Pt states he is driving home, will administer valium if he can find a ride

## 2018-04-07 NOTE — ED Notes (Signed)
Med student at bedside

## 2018-04-08 NOTE — ED Notes (Signed)
 2mg  valium tab wasted in sharps container with Roslynn Ambleori Philipps RN 04/08/2018 at 0201

## 2021-08-24 LAB — COLOGUARD: COLOGUARD: NEGATIVE

## 2021-10-10 ENCOUNTER — Other Ambulatory Visit: Payer: Self-pay | Admitting: *Deleted

## 2021-10-10 DIAGNOSIS — F17210 Nicotine dependence, cigarettes, uncomplicated: Secondary | ICD-10-CM

## 2021-10-24 ENCOUNTER — Ambulatory Visit (INDEPENDENT_AMBULATORY_CARE_PROVIDER_SITE_OTHER): Payer: Self-pay | Admitting: Primary Care

## 2021-10-24 ENCOUNTER — Other Ambulatory Visit: Payer: Self-pay

## 2021-10-24 ENCOUNTER — Encounter: Payer: Self-pay | Admitting: Primary Care

## 2021-10-24 DIAGNOSIS — F17200 Nicotine dependence, unspecified, uncomplicated: Secondary | ICD-10-CM

## 2021-10-24 NOTE — Progress Notes (Signed)
Virtual Visit via Telephone Note  I connected with Harpreet Pompey on 10/24/21 at  2:00 PM EST by telephone and verified that I am speaking with the correct person using two identifiers.  Location: Patient: Home Provider: Office    I discussed the limitations, risks, security and privacy concerns of performing an evaluation and management service by telephone and the availability of in person appointments. I also discussed with the patient that there may be a patient responsible charge related to this service. The patient expressed understanding and agreed to proceed.   Shared Decision Making Visit Lung Cancer Screening Program [phone])  Eligibility: Age 61 y.o. Pack Years Smoking History Calculation 28 (# packs/per year x # years smoked) Recent History of coughing up blood  no Unexplained weight loss? no ( >Than 15 pounds within the last 6 months ) Prior History Lung / other cancer no (Diagnosis within the last 5 years already requiring surveillance chest CT Scans). Smoking Status Current Smoker Former Smokers: Years since quit: NA  Quit Date: NA  Visit Components: Discussion included one or more decision making aids. yes Discussion included risk/benefits of screening. yes Discussion included potential follow up diagnostic testing for abnormal scans. yes Discussion included meaning and risk of over diagnosis. yes Discussion included meaning and risk of False Positives. yes Discussion included meaning of total radiation exposure. yes  Counseling Included: Importance of adherence to annual lung cancer LDCT screening. yes Impact of comorbidities on ability to participate in the program. yes Ability and willingness to under diagnostic treatment. yes  Smoking Cessation Counseling: Current Smokers:  Discussed importance of smoking cessation. yes Information about tobacco cessation classes and interventions provided to patient. yes Patient provided with "ticket" for LDCT Scan.  NA Symptomatic Patient. no  Counseling(Intermediate counseling: > three minutes) 99406 Diagnosis Code: Tobacco Use Z72.0 Asymptomatic Patient yes  Counseling (Intermediate counseling: > three minutes counseling) [id number] Former Smokers:  Discussed the importance of maintaining cigarette abstinence. yes Diagnosis Code: Personal History of Nicotine Dependence. K34.917 Information about tobacco cessation classes and interventions provided to patient. Yes Patient provided with "ticket" for LDCT Scan. NA Written Order for Lung Cancer Screening with LDCT placed in Epic. Yes (CT Chest Lung Cancer Screening Low Dose W/O CM) [id number] Z12.2-Screening of respiratory organs Z87.891-Personal history of nicotine dependence  I have spent 25 minutes of face to face/ virtual visit time with Mr Rosol discussing the risks and benefits of lung cancer screening. We viewed / discussed a power point together that explained in detail the above noted topics. We paused at intervals to allow for questions to be asked and answered to ensure understanding.We discussed that the single most powerful action that he can take to decrease his risk of developing lung cancer is to quit smoking. We discussed whether or not he is ready to commit to setting a quit date. We discussed options for tools to aid in quitting smoking including nicotine replacement therapy, non-nicotine medications, support groups, Quit Smart classes, and behavior modification. We discussed that often times setting smaller, more achievable goals, such as eliminating 1 cigarette a day for a week and then 2 cigarettes a day for a week can be helpful in slowly decreasing the number of cigarettes smoked. This allows for a sense of accomplishment as well as providing a clinical benefit. I provided  him  with smoking cessation  information  with contact information for community resources, classes, free nicotine replacement therapy, and access to mobile apps, text messaging,  and on-line smoking cessation  help. I have also provided him  the office contact information in the event he needs to contact me, or the screening staff. We discussed the time and location of the scan, and that either Abigail Miyamoto RN, Karlton Lemon, RN  or I will call / send a letter with the results within 24-72 hours of receiving them. The patient verbalized understanding of all of  the above and had no further questions upon leaving the office. They have my contact information in the event they have any further questions.  I spent 5 minutes counseling on smoking cessation and the health risks of continued tobacco abuse.  I explained to the patient that there has been a high incidence of coronary artery disease noted on these exams. I explained that this is a non-gated exam therefore degree or severity cannot be determined. This patient is not on statin therapy. I have asked the patient to follow-up with their PCP regarding any incidental finding of coronary artery disease and management with diet or medication as their PCP  feels is clinically indicated. The patient verbalized understanding of the above and had no further questions upon completion of the visit.   Glenford Bayley, NP

## 2021-10-24 NOTE — Patient Instructions (Signed)
Thank you for participating in the [facility]. °It was our pleasure to meet you today. °We will call you with the results of your scan within the next few days. °Your scan will be assigned a Lung RADS category score by the physicians reading the scans.  °This Lung RADS score determines follow up scanning.  °See below for description of categories, and follow up screening recommendations. °We will be in touch to schedule your follow up screening annually or based on recommendations of our providers. °We will fax a copy of your scan results to your Primary Care Physician, or the physician who referred you to the program, to ensure they have the results. °Please call the office if you have any questions or concerns regarding your scanning experience or results.  °Our office number is [phone]. °Please speak with Denise Phelps, RN. She is our Lung Cancer Screening RN. °If she is unavailable when you call, please have the office staff send her a message. She will return your call at her earliest convenience. °Remember, if your scan is normal, we will scan you annually as long as you continue to meet the criteria for the program. (Age 55-77, Current smoker or smoker who has quit within the last 15 years). °If you are a smoker, remember, quitting is the single most powerful action that you can take to decrease your risk of lung cancer and other pulmonary, breathing related problems. °We know quitting is hard, and we are here to help.  °Please let us know if there is anything we can do to help you meet your goal of quitting. °If you are a former smoker, congratulations. We are proud of you! Remain smoke free! °Remember you can refer friends or family members through the number above.  °We will screen them to make sure they meet criteria for the program. °Thank you for helping us take better care of you by participating in Lung Screening. ° °You can receive free nicotine replacement therapy  ( patches, gum or mints) by calling 1-800-QUIT NOW. Please call so we can get you on the path to becoming  a non-smoker. I know it is hard, but you can do this! ° °Lung RADS Categories: ° °Lung RADS 1: no nodules or definitely non-concerning nodules.  °Recommendation is for a repeat annual scan in 12 months. ° °Lung RADS 2:  nodules that are non-concerning in appearance and behavior with a very low likelihood of becoming an active cancer. °Recommendation is for a repeat annual scan in 12 months. ° °Lung RADS 3: nodules that are probably non-concerning , includes nodules with a low likelihood of becoming an active cancer.  Recommendation is for a 6-month repeat screening scan. Often noted after an upper respiratory illness. We will be in touch to make sure you have no questions, and to schedule your 6-month scan. ° °Lung RADS 4 A: nodules with concerning findings, recommendation is most often for a follow up scan in 3 months or additional testing based on our provider's assessment of the scan. We will be in touch to make sure you have no questions and to schedule the recommended 3 month follow up scan. ° °Lung RADS 4 B:  indicates findings that are concerning. We will be in touch with you to schedule additional diagnostic testing based on our provider's  assessment of the scan. ° °Hypnosis for smoking cessation  °[employer] °[phone] ° °Acupuncture for smoking cessation  °[facility] °[phone]  °

## 2021-10-25 ENCOUNTER — Ambulatory Visit
Admission: RE | Admit: 2021-10-25 | Discharge: 2021-10-25 | Disposition: A | Discharge status: Home / Self Care | Payer: BC Managed Care – PPO | Source: Ambulatory Visit | Attending: Acute Care | Admitting: Acute Care

## 2021-10-25 DIAGNOSIS — F17210 Nicotine dependence, cigarettes, uncomplicated: Secondary | ICD-10-CM | POA: Insufficient documentation

## 2021-10-26 ENCOUNTER — Other Ambulatory Visit: Payer: Self-pay | Admitting: Acute Care

## 2021-10-26 DIAGNOSIS — F17200 Nicotine dependence, unspecified, uncomplicated: Secondary | ICD-10-CM

## 2021-10-26 DIAGNOSIS — Z87891 Personal history of nicotine dependence: Secondary | ICD-10-CM

## 2022-04-24 ENCOUNTER — Other Ambulatory Visit: Payer: Self-pay | Admitting: Neurology

## 2022-04-24 DIAGNOSIS — R202 Paresthesia of skin: Secondary | ICD-10-CM

## 2022-05-07 ENCOUNTER — Ambulatory Visit
Admission: RE | Admit: 2022-05-07 | Discharge: 2022-05-07 | Disposition: A | Discharge status: Home / Self Care | Payer: BC Managed Care – PPO | Source: Ambulatory Visit | Attending: Neurology | Admitting: Neurology

## 2022-05-07 DIAGNOSIS — R202 Paresthesia of skin: Secondary | ICD-10-CM | POA: Diagnosis present

## 2022-05-14 ENCOUNTER — Encounter (INDEPENDENT_AMBULATORY_CARE_PROVIDER_SITE_OTHER): Payer: BC Managed Care – PPO | Admitting: Vascular Surgery

## 2022-05-14 ENCOUNTER — Encounter (INDEPENDENT_AMBULATORY_CARE_PROVIDER_SITE_OTHER): Payer: BC Managed Care – PPO

## 2022-05-25 ENCOUNTER — Other Ambulatory Visit (INDEPENDENT_AMBULATORY_CARE_PROVIDER_SITE_OTHER): Payer: Self-pay | Admitting: Vascular Surgery

## 2022-05-25 DIAGNOSIS — M79605 Pain in left leg: Secondary | ICD-10-CM

## 2022-05-25 DIAGNOSIS — J45909 Unspecified asthma, uncomplicated: Secondary | ICD-10-CM | POA: Insufficient documentation

## 2022-05-25 DIAGNOSIS — M79606 Pain in leg, unspecified: Secondary | ICD-10-CM | POA: Insufficient documentation

## 2022-05-25 NOTE — Progress Notes (Unsigned)
MRN : [id number]  Dominic Ferguson is a 61 y.o. (1961/03/22) male who presents with chief complaint of check circulation.  History of Present Illness:   The patient is seen for evaluation of painful lower extremities. Patient notes the pain is variable and not always associated with activity.  The pain is somewhat consistent day to day occurring on most days. The patient notes the pain also occurs with standing and routinely seems worse as the day wears on. The pain has been progressive over the past several years. The patient states these symptoms are causing  a negative impact on quality of life and daily activities which was a factor in the referral.  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   The patient denies rest pain or dangling of an extremity off the side of the bed during the night for relief. No open wounds or sores at this time. No history of DVT or phlebitis. No prior vascular interventions or surgeries.    No outpatient medications have been marked as taking for the 05/28/22 encounter (Appointment) with Gilda Crease, Latina Craver, MD.    No past medical history on file.  No past surgical history on file.  Social History Social History   Tobacco Use   Smoking status: Every Day    Packs/day: 1.00    Types: Cigarettes   Smokeless tobacco: Never  Substance Use Topics   Alcohol use: Yes    Alcohol/week: 49.0 standard drinks of alcohol    Types: 49 Cans of beer per week   Drug use: Never    Family History No family history on file.  No Known Allergies   REVIEW OF SYSTEMS (Negative unless checked)  Constitutional: [] Weight loss  [] Fever  [] Chills Cardiac: [] Chest pain   [] Chest pressure   [] Palpitations   [] Shortness of breath when laying flat   [] Shortness of breath with exertion. Vascular:  [x] Pain in legs with walking   [] Pain in legs at rest  [] History of DVT   [] Phlebitis   [] Swelling in legs   [] Varicose  veins   [] Non-healing ulcers Pulmonary:   [] Uses home oxygen   [] Productive cough   [] Hemoptysis   [] Wheeze  [] COPD   [x] Asthma Neurologic:  [] Dizziness   [] Seizures   [] History of stroke   [] History of TIA  [] Aphasia   [] Vissual changes   [] Weakness or numbness in arm   [] Weakness or numbness in leg Musculoskeletal:   [] Joint swelling   [] Joint pain   [] Low back pain Hematologic:  [] Easy bruising  [] Easy bleeding   [] Hypercoagulable state   [] Anemic Gastrointestinal:  [] Diarrhea   [] Vomiting  [] Gastroesophageal reflux/heartburn   [] Difficulty swallowing. Genitourinary:  [] Chronic kidney disease   [] Difficult urination  [] Frequent urination   [] Blood in urine Skin:  [] Rashes   [] Ulcers  Psychological:  [] History of anxiety   []  History of major depression.  Physical Examination  There were no vitals filed for this visit. There is no height or weight on file to calculate BMI. Gen: WD/WN, NAD Head: [place]/AT, No temporalis wasting.  Ear/Nose/Throat: Hearing grossly intact, nares w/o erythema or drainage Eyes: PER, EOMI, sclera nonicteric.  Neck: Supple, no masses.  No bruit or JVD.  Pulmonary:  Good air movement, no audible wheezing, no use of accessory muscles.  Cardiac: RRR, normal S1, S2, no Murmurs. Vascular:  mild trophic changes, no open  wounds Vessel Right Left  Radial Palpable Palpable  PT Not Palpable Not Palpable  DP Not Palpable Not Palpable  Gastrointestinal: soft, non-distended. No guarding/no peritoneal signs.  Musculoskeletal: M/S 5/5 throughout.  No visible deformity.  Neurologic: CN 2-12 intact. Pain and light touch intact in extremities.  Symmetrical.  Speech is fluent. Motor exam as listed above. Psychiatric: Judgment intact, Mood & affect appropriate for pt's clinical situation. Dermatologic: No rashes or ulcers noted.  No changes consistent with cellulitis.   CBC No results found for: "WBC", "HGB", "HCT", "MCV", "PLT"  BMET No results found for: "NA", "K", "CL",  "CO2", "GLUCOSE", "BUN", "CREATININE", "CALCIUM", "GFRNONAA", "GFRAA" CrCl cannot be calculated (No successful lab value found.).  COAG No results found for: "INR", "PROTIME"  Radiology MR LUMBAR SPINE WO CONTRAST  Result Date: 05/07/2022 CLINICAL DATA:  Back and left leg pain since a fall in 2022 EXAM: MRI LUMBAR SPINE WITHOUT CONTRAST TECHNIQUE: Multiplanar, multisequence MR imaging of the lumbar spine was performed. No intravenous contrast was administered. COMPARISON:  None Available. FINDINGS: Segmentation: There are five lumbar type vertebral bodies. The last full intervertebral disc space is labeled L5-S1. Alignment:  Normal Vertebrae:  Normal marrow signal. No bone lesions or fractures. Conus medullaris and cauda equina: Conus extends to the top of L2 level. Conus and cauda equina appear normal. Paraspinal and other soft tissues: No significant paraspinal or retroperitoneal findings. Disc levels: T12-L1: No significant findings. L1-2: No significant findings. L2-3: Mild facet disease and mild bulging annulus contributing to mild bilateral lateral recess encroachment and mild bilateral foraminal encroachment. L3-4: Diffuse annular bulge, moderate facet disease and ligamentum flavum thickening contributing to early spinal stenosis and mild bilateral lateral recess stenosis. No foraminal stenosis. L4-5: Bulging annulus, facet disease and ligamentum flavum thickening contributing to mild spinal and bilateral lateral recess stenosis. No foraminal stenosis. L5-S1: Bulging annulus and mild facet disease but no disc protrusions, significant spinal or foraminal stenosis. IMPRESSION: 1. Mild bilateral lateral recess encroachment and mild bilateral foraminal encroachment at L2-3. 2. Early spinal stenosis and mild bilateral lateral recess stenosis at L3-4. 3. Mild spinal and bilateral lateral recess stenosis at L4-5. Electronically Signed   By: Rudie Meyer M.D.   On: 05/07/2022 14:11      Assessment/Plan There are no diagnoses linked to this encounter.   Levora Dredge, MD  05/25/2022 2:27 PM

## 2022-05-28 ENCOUNTER — Ambulatory Visit (INDEPENDENT_AMBULATORY_CARE_PROVIDER_SITE_OTHER): Payer: BC Managed Care – PPO

## 2022-05-28 ENCOUNTER — Ambulatory Visit (INDEPENDENT_AMBULATORY_CARE_PROVIDER_SITE_OTHER): Payer: BC Managed Care – PPO | Admitting: Vascular Surgery

## 2022-05-28 ENCOUNTER — Encounter (INDEPENDENT_AMBULATORY_CARE_PROVIDER_SITE_OTHER): Payer: Self-pay | Admitting: Vascular Surgery

## 2022-05-28 DIAGNOSIS — M47817 Spondylosis without myelopathy or radiculopathy, lumbosacral region: Secondary | ICD-10-CM | POA: Insufficient documentation

## 2022-05-28 DIAGNOSIS — M79604 Pain in right leg: Secondary | ICD-10-CM | POA: Diagnosis not present

## 2022-05-28 DIAGNOSIS — I70213 Atherosclerosis of native arteries of extremities with intermittent claudication, bilateral legs: Secondary | ICD-10-CM | POA: Diagnosis not present

## 2022-05-28 DIAGNOSIS — J45909 Unspecified asthma, uncomplicated: Secondary | ICD-10-CM | POA: Diagnosis not present

## 2022-05-28 DIAGNOSIS — I70219 Atherosclerosis of native arteries of extremities with intermittent claudication, unspecified extremity: Secondary | ICD-10-CM | POA: Insufficient documentation

## 2022-05-28 DIAGNOSIS — M79605 Pain in left leg: Secondary | ICD-10-CM

## 2022-10-25 ENCOUNTER — Ambulatory Visit: Admission: RE | Admit: 2022-10-25 | Payer: BC Managed Care – PPO | Source: Ambulatory Visit

## 2022-12-07 ENCOUNTER — Telehealth: Payer: Self-pay | Admitting: *Deleted

## 2022-12-07 ENCOUNTER — Other Ambulatory Visit: Payer: Self-pay

## 2022-12-07 ENCOUNTER — Encounter: Payer: Self-pay | Admitting: *Deleted

## 2022-12-07 DIAGNOSIS — Z87891 Personal history of nicotine dependence: Secondary | ICD-10-CM

## 2022-12-07 DIAGNOSIS — F17210 Nicotine dependence, cigarettes, uncomplicated: Secondary | ICD-10-CM

## 2022-12-07 DIAGNOSIS — F17200 Nicotine dependence, unspecified, uncomplicated: Secondary | ICD-10-CM

## 2022-12-07 NOTE — Telephone Encounter (Signed)
Left message for pt to call back to reschedule lung cancer screening Ct. Also mailed letter to pt.

## 2022-12-24 ENCOUNTER — Ambulatory Visit
Admission: RE | Admit: 2022-12-24 | Discharge: 2022-12-24 | Disposition: A | Discharge status: Home / Self Care | Payer: BC Managed Care – PPO | Source: Ambulatory Visit | Attending: Infectious Diseases | Admitting: Infectious Diseases

## 2022-12-24 DIAGNOSIS — F17210 Nicotine dependence, cigarettes, uncomplicated: Secondary | ICD-10-CM

## 2022-12-24 DIAGNOSIS — F17200 Nicotine dependence, unspecified, uncomplicated: Secondary | ICD-10-CM

## 2022-12-24 DIAGNOSIS — Z87891 Personal history of nicotine dependence: Secondary | ICD-10-CM

## 2022-12-25 ENCOUNTER — Other Ambulatory Visit: Payer: Self-pay

## 2022-12-25 DIAGNOSIS — F17210 Nicotine dependence, cigarettes, uncomplicated: Secondary | ICD-10-CM

## 2022-12-25 DIAGNOSIS — Z87891 Personal history of nicotine dependence: Secondary | ICD-10-CM

## 2023-05-19 NOTE — Progress Notes (Unsigned)
MRN : [id number]  Dominic Ferguson is a 62 y.o. (1961/07/25) male who presents with chief complaint of check circulation.  History of Present Illness: ***  No outpatient medications have been marked as taking for the 05/20/23 encounter (Appointment) with Gilda Crease, Latina Craver, MD.    No past medical history on file.  No past surgical history on file.  Social History Social History   Tobacco Use   Smoking status: Every Day    Current packs/day: 1.00    Types: Cigarettes   Smokeless tobacco: Never  Substance Use Topics   Alcohol use: Yes    Alcohol/week: 49.0 standard drinks of alcohol    Types: 49 Cans of beer per week   Drug use: Never    Family History No family history on file.  No Known Allergies   REVIEW OF SYSTEMS (Negative unless checked)  Constitutional: [] Weight loss  [] Fever  [] Chills Cardiac: [] Chest pain   [] Chest pressure   [] Palpitations   [] Shortness of breath when laying flat   [] Shortness of breath with exertion. Vascular:  [x] Pain in legs with walking   [] Pain in legs at rest  [] History of DVT   [] Phlebitis   [] Swelling in legs   [] Varicose veins   [] Non-healing ulcers Pulmonary:   [] Uses home oxygen   [] Productive cough   [] Hemoptysis   [] Wheeze  [] COPD   [x] Asthma Neurologic:  [] Dizziness   [] Seizures   [] History of stroke   [] History of TIA  [] Aphasia   [] Vissual changes   [] Weakness or numbness in arm   [x] Weakness or numbness in leg Musculoskeletal:   [] Joint swelling   [x] Joint pain   [x] Low back pain Hematologic:  [] Easy bruising  [] Easy bleeding   [] Hypercoagulable state   [] Anemic Gastrointestinal:  [] Diarrhea   [] Vomiting  [] Gastroesophageal reflux/heartburn   [] Difficulty swallowing. Genitourinary:  [] Chronic kidney disease   [] Difficult urination  [] Frequent urination   [] Blood in urine Skin:  [] Rashes   [] Ulcers  Psychological:  [] History of anxiety   []  History of major  depression.  Physical Examination  There were no vitals filed for this visit. There is no height or weight on file to calculate BMI. Gen: WD/WN, NAD Head: [place]/AT, No temporalis wasting.  Ear/Nose/Throat: Hearing grossly intact, nares w/o erythema or drainage Eyes: PER, EOMI, sclera nonicteric.  Neck: Supple, no masses.  No bruit or JVD.  Pulmonary:  Good air movement, no audible wheezing, no use of accessory muscles.  Cardiac: RRR, normal S1, S2, no Murmurs. Vascular:  mild trophic changes, no open wounds Vessel Right Left  Radial Palpable Palpable  PT Not Palpable Not Palpable  DP Not Palpable Not Palpable  Gastrointestinal: soft, non-distended. No guarding/no peritoneal signs.  Musculoskeletal: M/S 5/5 throughout.  No visible deformity.  Neurologic: CN 2-12 intact. Pain and light touch intact in extremities.  Symmetrical.  Speech is fluent. Motor exam as listed above. Psychiatric: Judgment intact, Mood & affect appropriate for pt's clinical situation. Dermatologic: No rashes or ulcers noted.  No changes consistent with cellulitis.   CBC No results found for: "WBC", "HGB", "HCT", "MCV", "PLT"  BMET  No results found for: "NA", "K", "CL", "CO2", "GLUCOSE", "BUN", "CREATININE", "CALCIUM", "GFRNONAA", "GFRAA" CrCl cannot be calculated (No successful lab value found.).  COAG No results found for: "INR", "PROTIME"  Radiology No results found.   Assessment/Plan There are no diagnoses linked to this encounter.   Levora Dredge, MD  05/19/2023 2:39 PM

## 2023-05-20 ENCOUNTER — Ambulatory Visit (INDEPENDENT_AMBULATORY_CARE_PROVIDER_SITE_OTHER): Payer: BC Managed Care – PPO

## 2023-05-20 ENCOUNTER — Ambulatory Visit (INDEPENDENT_AMBULATORY_CARE_PROVIDER_SITE_OTHER): Payer: BC Managed Care – PPO | Admitting: Vascular Surgery

## 2023-05-20 ENCOUNTER — Encounter (INDEPENDENT_AMBULATORY_CARE_PROVIDER_SITE_OTHER): Payer: Self-pay | Admitting: Vascular Surgery

## 2023-05-20 VITALS — BP 148/77 | HR 69 | Resp 16 | Wt 181.4 lb

## 2023-05-20 DIAGNOSIS — I70213 Atherosclerosis of native arteries of extremities with intermittent claudication, bilateral legs: Secondary | ICD-10-CM

## 2023-05-20 DIAGNOSIS — M79605 Pain in left leg: Secondary | ICD-10-CM

## 2023-05-20 DIAGNOSIS — M79604 Pain in right leg: Secondary | ICD-10-CM

## 2023-05-20 DIAGNOSIS — J45909 Unspecified asthma, uncomplicated: Secondary | ICD-10-CM | POA: Diagnosis not present

## 2023-05-20 DIAGNOSIS — M47817 Spondylosis without myelopathy or radiculopathy, lumbosacral region: Secondary | ICD-10-CM

## 2023-05-21 ENCOUNTER — Encounter (INDEPENDENT_AMBULATORY_CARE_PROVIDER_SITE_OTHER): Payer: Self-pay | Admitting: Vascular Surgery

## 2023-10-01 DIAGNOSIS — R0602 Shortness of breath: Secondary | ICD-10-CM

## 2023-10-03 ENCOUNTER — Encounter: Payer: Self-pay | Admitting: Internal Medicine

## 2023-10-03 ENCOUNTER — Observation Stay
Admission: RE | Admit: 2023-10-03 | Discharge: 2023-10-04 | Disposition: A | Discharge status: Home / Self Care | Payer: BC Managed Care – PPO | Attending: Cardiology | Admitting: Cardiology

## 2023-10-03 ENCOUNTER — Other Ambulatory Visit: Payer: Self-pay

## 2023-10-03 ENCOUNTER — Encounter
Admission: RE | Disposition: A | Discharge status: Home / Self Care | Payer: Self-pay | Source: Home / Self Care | Attending: Cardiology

## 2023-10-03 DIAGNOSIS — J449 Chronic obstructive pulmonary disease, unspecified: Secondary | ICD-10-CM | POA: Insufficient documentation

## 2023-10-03 DIAGNOSIS — I10 Essential (primary) hypertension: Secondary | ICD-10-CM | POA: Insufficient documentation

## 2023-10-03 DIAGNOSIS — R9430 Abnormal result of cardiovascular function study, unspecified: Principal | ICD-10-CM

## 2023-10-03 DIAGNOSIS — I25119 Atherosclerotic heart disease of native coronary artery with unspecified angina pectoris: Secondary | ICD-10-CM | POA: Diagnosis not present

## 2023-10-03 DIAGNOSIS — F17210 Nicotine dependence, cigarettes, uncomplicated: Secondary | ICD-10-CM | POA: Insufficient documentation

## 2023-10-03 DIAGNOSIS — Z7982 Long term (current) use of aspirin: Secondary | ICD-10-CM | POA: Insufficient documentation

## 2023-10-03 DIAGNOSIS — Z79899 Other long term (current) drug therapy: Secondary | ICD-10-CM | POA: Insufficient documentation

## 2023-10-03 DIAGNOSIS — Z955 Presence of coronary angioplasty implant and graft: Secondary | ICD-10-CM

## 2023-10-03 DIAGNOSIS — R0609 Other forms of dyspnea: Secondary | ICD-10-CM | POA: Diagnosis present

## 2023-10-03 DIAGNOSIS — R0602 Shortness of breath: Secondary | ICD-10-CM

## 2023-10-03 HISTORY — PX: LEFT HEART CATH AND CORONARY ANGIOGRAPHY: CATH118249

## 2023-10-03 HISTORY — PX: CORONARY STENT INTERVENTION: CATH118234

## 2023-10-03 LAB — POCT ACTIVATED CLOTTING TIME: Activated Clotting Time: 348 s

## 2023-10-03 LAB — CARDIAC CATHETERIZATION: Cath EF Quantitative: 55 %

## 2023-10-03 SURGERY — LEFT HEART CATH AND CORONARY ANGIOGRAPHY
Anesthesia: Moderate Sedation

## 2023-10-03 MED ORDER — TICAGRELOR 90 MG PO TABS
90.0000 mg | ORAL_TABLET | Freq: Two times a day (BID) | ORAL | Status: DC
  Administered 2023-10-03: 90 mg via ORAL
  Filled 2023-10-03: qty 1

## 2023-10-03 MED ORDER — ASPIRIN 81 MG PO CHEW
CHEWABLE_TABLET | ORAL | Status: DC | PRN
  Administered 2023-10-03: 243 mg via ORAL

## 2023-10-03 MED ORDER — ASPIRIN 81 MG PO CHEW: 81.0000 mg | CHEWABLE_TABLET | Freq: Every day | ORAL | Status: DC

## 2023-10-03 MED ORDER — MIDAZOLAM HCL 2 MG/2ML IJ SOLN
INTRAMUSCULAR | Status: DC | PRN
  Administered 2023-10-03 (×2): 1 mg via INTRAVENOUS

## 2023-10-03 MED ORDER — METOPROLOL SUCCINATE ER 25 MG PO TB24: 12.5000 mg | ORAL_TABLET | Freq: Every day | ORAL | Status: DC

## 2023-10-03 MED ORDER — ATORVASTATIN CALCIUM 20 MG PO TABS
40.0000 mg | ORAL_TABLET | Freq: Every day | ORAL | Status: DC
  Administered 2023-10-03: 40 mg via ORAL
  Filled 2023-10-03: qty 2

## 2023-10-03 MED ORDER — LOSARTAN POTASSIUM 25 MG PO TABS
25.0000 mg | ORAL_TABLET | Freq: Every day | ORAL | Status: DC
  Administered 2023-10-03: 25 mg via ORAL
  Filled 2023-10-03: qty 1

## 2023-10-03 MED ORDER — SODIUM CHLORIDE 0.9 % WEIGHT BASED INFUSION
227.4000 mL/h | INTRAVENOUS | Status: AC
  Administered 2023-10-03: 227.4 mL/h via INTRAVENOUS

## 2023-10-03 MED ORDER — HEPARIN (PORCINE) IN NACL 1000-0.9 UT/500ML-% IV SOLN
INTRAVENOUS | Status: DC | PRN
  Administered 2023-10-03 (×2): 500 mL

## 2023-10-03 MED ORDER — HEPARIN (PORCINE) IN NACL 1000-0.9 UT/500ML-% IV SOLN
INTRAVENOUS | Status: AC
  Filled 2023-10-03: qty 1000

## 2023-10-03 MED ORDER — TICAGRELOR 90 MG PO TABS
ORAL_TABLET | ORAL | Status: DC | PRN
  Administered 2023-10-03: 180 mg via ORAL

## 2023-10-03 MED ORDER — UMECLIDINIUM BROMIDE 62.5 MCG/ACT IN AEPB
1.0000 | INHALATION_SPRAY | Freq: Every day | RESPIRATORY_TRACT | Status: DC
  Filled 2023-10-03: qty 7

## 2023-10-03 MED ORDER — ACETAMINOPHEN 325 MG PO TABS
650.0000 mg | ORAL_TABLET | Freq: Once | ORAL | Status: AC
  Administered 2023-10-03: 650 mg via ORAL

## 2023-10-03 MED ORDER — VERAPAMIL HCL 2.5 MG/ML IV SOLN
INTRAVENOUS | Status: AC
  Filled 2023-10-03: qty 2

## 2023-10-03 MED ORDER — FENTANYL CITRATE (PF) 100 MCG/2ML IJ SOLN
INTRAMUSCULAR | Status: DC | PRN
  Administered 2023-10-03 (×2): 25 ug via INTRAVENOUS

## 2023-10-03 MED ORDER — VERAPAMIL HCL 2.5 MG/ML IV SOLN
INTRAVENOUS | Status: DC | PRN
  Administered 2023-10-03: 2.5 mg via INTRA_ARTERIAL

## 2023-10-03 MED ORDER — SODIUM CHLORIDE 0.9 % WEIGHT BASED INFUSION
75.8000 mL/h | INTRAVENOUS | Status: DC
Start: 2023-10-03 — End: 2023-10-03

## 2023-10-03 MED ORDER — LIDOCAINE HCL 1 % IJ SOLN
INTRAMUSCULAR | Status: AC
  Filled 2023-10-03: qty 20

## 2023-10-03 MED ORDER — ASPIRIN 81 MG PO CHEW: 81.0000 mg | CHEWABLE_TABLET | ORAL | Status: AC

## 2023-10-03 MED ORDER — SODIUM CHLORIDE 0.9 % WEIGHT BASED INFUSION
1.0000 mL/kg/h | INTRAVENOUS | Status: AC
  Administered 2023-10-03: 1 mL/kg/h via INTRAVENOUS

## 2023-10-03 MED ORDER — HEPARIN SODIUM (PORCINE) 1000 UNIT/ML IJ SOLN
INTRAMUSCULAR | Status: AC
Start: 2023-10-03 — End: ?
  Filled 2023-10-03: qty 10

## 2023-10-03 MED ORDER — HEPARIN SODIUM (PORCINE) 1000 UNIT/ML IJ SOLN
INTRAMUSCULAR | Status: AC
  Filled 2023-10-03: qty 10

## 2023-10-03 MED ORDER — TICAGRELOR 90 MG PO TABS
ORAL_TABLET | ORAL | Status: AC
  Filled 2023-10-03: qty 2

## 2023-10-03 MED ORDER — ASPIRIN 81 MG PO CHEW
CHEWABLE_TABLET | ORAL | Status: AC
  Filled 2023-10-03: qty 3

## 2023-10-03 MED ORDER — SODIUM CHLORIDE 0.9% FLUSH
3.0000 mL | Freq: Two times a day (BID) | INTRAVENOUS | Status: DC
Start: 2023-10-03 — End: 2023-10-03
  Administered 2023-10-03: 3 mL via INTRAVENOUS

## 2023-10-03 MED ORDER — VITAMIN D 25 MCG (1000 UNIT) PO TABS
1000.0000 [IU] | ORAL_TABLET | Freq: Every day | ORAL | Status: DC
Start: 2023-10-03 — End: 2023-10-04
  Administered 2023-10-03: 1000 [IU] via ORAL
  Filled 2023-10-03: qty 1

## 2023-10-03 MED ORDER — BUPROPION HCL ER (SR) 150 MG PO TB12
150.0000 mg | ORAL_TABLET | Freq: Two times a day (BID) | ORAL | Status: DC
Start: 2023-10-03 — End: 2023-10-04
  Administered 2023-10-03: 150 mg via ORAL
  Filled 2023-10-03 (×2): qty 1

## 2023-10-03 MED ORDER — LIDOCAINE HCL (PF) 1 % IJ SOLN
INTRAMUSCULAR | Status: DC | PRN
  Administered 2023-10-03: 2 mL

## 2023-10-03 MED ORDER — SODIUM CHLORIDE 0.9% FLUSH: 3.0000 mL | INTRAVENOUS | Status: DC | PRN

## 2023-10-03 MED ORDER — FENTANYL CITRATE (PF) 100 MCG/2ML IJ SOLN
INTRAMUSCULAR | Status: AC
  Filled 2023-10-03: qty 2

## 2023-10-03 MED ORDER — SODIUM CHLORIDE 0.9 % IV SOLN: 250.0000 mL | INTRAVENOUS | Status: DC | PRN

## 2023-10-03 MED ORDER — MIDAZOLAM HCL 2 MG/2ML IJ SOLN
INTRAMUSCULAR | Status: AC
  Filled 2023-10-03: qty 2

## 2023-10-03 MED ORDER — ACETAMINOPHEN 325 MG PO TABS
ORAL_TABLET | ORAL | Status: AC
  Filled 2023-10-03: qty 2

## 2023-10-03 MED ORDER — HEPARIN SODIUM (PORCINE) 1000 UNIT/ML IJ SOLN
INTRAMUSCULAR | Status: DC | PRN
  Administered 2023-10-03: 3500 [IU] via INTRAVENOUS
  Administered 2023-10-03: 10000 [IU] via INTRAVENOUS

## 2023-10-03 MED ORDER — ASPIRIN 81 MG PO CHEW
CHEWABLE_TABLET | ORAL | Status: AC
  Administered 2023-10-03: 81 mg via ORAL
  Filled 2023-10-03: qty 1

## 2023-10-03 MED ORDER — IOHEXOL 300 MG/ML  SOLN
INTRAMUSCULAR | Status: DC | PRN
  Administered 2023-10-03: 165 mL

## 2023-10-03 MED ORDER — FLUTICASONE FUROATE-VILANTEROL 100-25 MCG/ACT IN AEPB
1.0000 | INHALATION_SPRAY | Freq: Every day | RESPIRATORY_TRACT | Status: DC
  Filled 2023-10-03: qty 28

## 2023-10-03 MED ORDER — NICOTINE 21 MG/24HR TD PT24
21.0000 mg | MEDICATED_PATCH | Freq: Every day | TRANSDERMAL | Status: DC
Start: 2023-10-03 — End: 2023-10-04
  Administered 2023-10-03: 21 mg via TRANSDERMAL
  Filled 2023-10-03: qty 1

## 2023-10-03 SURGICAL SUPPLY — 17 items
BALLN TREK RX 2.5X15 (BALLOONS) ×2
BALLOON TREK RX 2.5X15 (BALLOONS) IMPLANT
CATH 5FR JL3.5 JR4 ANG PIG MP (CATHETERS) IMPLANT
CATH VISTA GUIDE 6FR JR4 SH (CATHETERS) IMPLANT
DEVICE RAD TR BAND REGULAR (VASCULAR PRODUCTS) IMPLANT
DRAPE BRACHIAL (DRAPES) IMPLANT
GLIDESHEATH SLEND SS 6F .021 (SHEATH) IMPLANT
GUIDEWIRE INQWIRE 1.5J.035X260 (WIRE) IMPLANT
INQWIRE 1.5J .035X260CM (WIRE) ×2
KIT ENCORE 26 ADVANTAGE (KITS) IMPLANT
PACK CARDIAC CATH (CUSTOM PROCEDURE TRAY) ×2 IMPLANT
PROTECTION STATION PRESSURIZED (MISCELLANEOUS) ×2
SET ATX-X65L (MISCELLANEOUS) IMPLANT
STATION PROTECTION PRESSURIZED (MISCELLANEOUS) IMPLANT
STENT ONYX FRONTIER 3.5X15 (Permanent Stent) IMPLANT
TUBING CIL FLEX 10 FLL-RA (TUBING) IMPLANT
WIRE G HI TQ BMW 190 (WIRE) IMPLANT

## 2023-10-03 NOTE — Plan of Care (Signed)

## 2023-10-03 NOTE — Progress Notes (Signed)
TR band removed.

## 2023-10-04 ENCOUNTER — Encounter: Payer: Self-pay | Admitting: Internal Medicine

## 2023-10-04 ENCOUNTER — Other Ambulatory Visit (HOSPITAL_COMMUNITY): Payer: Self-pay

## 2023-10-04 ENCOUNTER — Telehealth (HOSPITAL_COMMUNITY): Payer: Self-pay

## 2023-10-04 DIAGNOSIS — I25119 Atherosclerotic heart disease of native coronary artery with unspecified angina pectoris: Secondary | ICD-10-CM | POA: Diagnosis not present

## 2023-10-04 DIAGNOSIS — Z955 Presence of coronary angioplasty implant and graft: Secondary | ICD-10-CM | POA: Diagnosis not present

## 2023-10-04 LAB — CBC
HCT: 45.9 % (ref 39.0–52.0)
Hemoglobin: 16.1 g/dL (ref 13.0–17.0)
MCH: 30.3 pg (ref 26.0–34.0)
MCHC: 35.1 g/dL (ref 30.0–36.0)
MCV: 86.3 fL (ref 80.0–100.0)
Platelets: 221 10*3/uL (ref 150–400)
RBC: 5.32 MIL/uL (ref 4.22–5.81)
RDW: 12.8 % (ref 11.5–15.5)
WBC: 10.2 10*3/uL (ref 4.0–10.5)
nRBC: 0 % (ref 0.0–0.2)

## 2023-10-04 LAB — BASIC METABOLIC PANEL
Anion gap: 11 (ref 5–15)
BUN: 20 mg/dL (ref 6–20)
CO2: 24 mmol/L (ref 22–32)
Calcium: 8.9 mg/dL (ref 8.9–10.3)
Chloride: 104 mmol/L (ref 98–111)
Creatinine, Ser: 0.82 mg/dL (ref 0.61–1.24)
GFR, Estimated: >60 mL/min (ref >60)
Glucose, Bld: 81 mg/dL (ref 70–99)
Potassium: 3.5 mmol/L (ref 3.5–5.1)
Sodium: 139 mmol/L (ref 135–145)

## 2023-10-04 MED ORDER — ATORVASTATIN CALCIUM 40 MG PO TABS: 40.0000 mg | ORAL_TABLET | Freq: Every day | ORAL | 0 refills | Status: AC

## 2023-10-04 MED ORDER — ASPIRIN 81 MG PO CHEW: 81.0000 mg | CHEWABLE_TABLET | Freq: Every day | ORAL | 0 refills | Status: AC

## 2023-10-04 MED ORDER — TICAGRELOR 90 MG PO TABS: 90.0000 mg | ORAL_TABLET | Freq: Two times a day (BID) | ORAL | 0 refills | Status: DC

## 2023-10-04 MED ORDER — METOPROLOL SUCCINATE ER 25 MG PO TB24: 12.5000 mg | ORAL_TABLET | Freq: Every day | ORAL | 0 refills | Status: AC

## 2023-10-04 MED ORDER — LOSARTAN POTASSIUM 25 MG PO TABS: 25.0000 mg | ORAL_TABLET | Freq: Every day | ORAL | 0 refills | Status: AC

## 2023-10-04 NOTE — Telephone Encounter (Signed)
Pharmacy Patient Advocate Encounter  Insurance verification completed.    The patient is insured through  [employer] .     Ran test claim for Brilinta and the current 30 day co-pay is $0.00.   This test claim was processed through [facility]- copay amounts may vary at other pharmacies due to pharmacy/plan contracts, or as the patient moves through the different stages of their insurance plan.

## 2023-10-04 NOTE — Discharge Summary (Signed)
Discharge Summary     Patient ID: Dominic Ferguson MRN: [id number] DOB/AGE: October 26, 1960 62 y.o.  Admit date: 10/03/2023 Discharge date: 10/04/2023  Primary Discharge Diagnosis Coronary artery disease with angina Secondary Discharge Diagnosis S/p coronary stent placement  Significant Diagnostic Studies: Left heart catheterization  Consults: None  Hospital Course: The patient was brought to the cardiac cath lab and underwent left heart catheterization and coronary angiography with Dr. Juliann Pares on 10/03/2023. The patient tolerated with procedure well without complications.  The patient received 3.0 x 15 mm frontier Onyx to the ostial RCA. On 10/04/2023 the right wrist access site was examined and found to be without significant erythema, tenderness to palpation, or apparent aneurysm. Hospital course was overall uneventful, on day of discharge the patient was ambulatory and eager to go home.  Discussed cardiac rehab and new prescription medications in detail. The patient was given aftercare instructions and ER return precautions. Will arrange for follow up in office in 1 week, or sooner if needed.      Discharge Exam: Blood pressure 122/68, pulse 66, temperature 98 F (36.7 C), temperature source Oral, resp. rate 20, height 5\' 8"  (1.727 m), weight 74.1 kg, SpO2 95%.   General: Well appearing male, well nourished, in no acute distress.  HEENT:  Normocephalic and atraumatic. Neck:   No JVD.  Lungs: Normal respiratory effort on room air.  Clear to ascultation bilaterally. Heart: HRRR . Normal S1 and S2 without gallops or murmurs.  Abdomen: non-distended appearing.  Msk: Normal strength and tone for age. Extremities: No peripheral edema. R radial access site without bleeding, significant tenderness to palpation, apparent aneurysm, or significant ecchymosis. Covered with clean gauze and tegaderm dressing.  Neuro: Alert and oriented x3 Psych:  calm and cooperative.   Labs:   Lab Results   Component Value Date   WBC 10.2 10/04/2023   HGB 16.1 10/04/2023   HCT 45.9 10/04/2023   MCV 86.3 10/04/2023   PLT 221 10/04/2023    Recent Labs  Lab 10/04/23 0420  NA 139  K 3.5  CL 104  CO2 24  BUN 20  CREATININE 0.82  CALCIUM 8.9  GLUCOSE 81      Radiology: None EKG: NSR rate 65 bpm  FOLLOW UP PLANS AND APPOINTMENTS Discharge Instructions     AMB Referral to Cardiac Rehabilitation - Phase II   Complete by: As directed    Diagnosis: Coronary Stents   After initial evaluation and assessments completed: Virtual Based Care may be provided alone or in conjunction with Phase 2 Cardiac Rehab based on patient barriers.: Yes      Allergies as of 10/04/2023   No Known Allergies      Medication List     STOP taking these medications    hydrochlorothiazide 25 MG tablet Commonly known as: HYDRODIURIL       TAKE these medications    aspirin 81 MG chewable tablet Chew 1 tablet (81 mg total) by mouth daily.   atorvastatin 40 MG tablet Commonly known as: LIPITOR Take 1 tablet (40 mg total) by mouth daily. What changed:  medication strength how much to take   buPROPion 150 MG 12 hr tablet Commonly known as: WELLBUTRIN SR Take 150 mg by mouth 2 (two) times daily.   cholecalciferol 25 MCG (1000 UNIT) tablet Commonly known as: VITAMIN D3 Take 1,000 Units by mouth daily.   losartan 25 MG tablet Commonly known as: COZAAR Take 1 tablet (25 mg total) by mouth daily.   metoprolol succinate  25 MG 24 hr tablet Commonly known as: TOPROL-XL Take 0.5 tablets (12.5 mg total) by mouth daily.   ticagrelor 90 MG Tabs tablet Commonly known as: BRILINTA Take 1 tablet (90 mg total) by mouth 2 (two) times daily.   Trelegy Ellipta 100-62.5-25 MCG/ACT Aepb Generic drug: Fluticasone-Umeclidin-Vilant Inhale 1 puff into the lungs daily.        Follow-up Information     Alluri, Meryl Dare, MD. Go in 1 week(s).   Specialty: Cardiology Contact information: [address] [place] [address] [phone]                 PLEASE BRING ALL MEDICATIONS WITH YOU TO FOLLOW UP APPOINTMENTS  Time spent with patient: 2  Signed: Gale Journey PA-C 10/04/2023, 8:51 AM [facility] Cardiology

## 2023-10-04 NOTE — Plan of Care (Signed)

## 2023-10-04 NOTE — TOC CM/SW Note (Signed)
Transition of Care [facility]) - Inpatient Brief Assessment   Patient Details  Name: Dominic Ferguson MRN: [id number] Date of Birth: January 09, 1961  Transition of Care [facility]) CM/SW Contact:    Margarito Liner, LCSW Phone Number: 10/04/2023, 9:01 AM   Clinical Narrative: Patient has orders to discharge home today. Chart reviewed. No TOC needs identified. CSW signing off.  Transition of Care Asessment: Insurance and Status: Insurance coverage has been reviewed Patient has primary care physician: Yes Home environment has been reviewed: Single familiy home Prior level of function:: Not documented Prior/Current Home Services: No current home services Social Drivers of Health Review: SDOH reviewed no interventions necessary Readmission risk has been reviewed: Yes Transition of care needs: no transition of care needs at this time

## 2023-10-06 LAB — LIPOPROTEIN A (LPA): Lipoprotein (a): 50.7 nmol/L — ABNORMAL HIGH (ref <75.0)

## 2023-11-12 ENCOUNTER — Telehealth: Payer: Self-pay

## 2023-11-12 NOTE — Telephone Encounter (Signed)
Hi Dr. Meredeth Ide,   Patient called and advised he has seen you recently and you would be ordering his LDCT for his annual lung cancer screen this year. He is due 01/03/2024 and is currently in our program. However, he did seem to think he would need a lung CT sooner than 12/2023 due to current concerns for cancer. I just wanted to make sure I understood him correctly and we should cancel our CT for March. Thank you!  Revonda Standard, RN

## 2023-11-26 ENCOUNTER — Other Ambulatory Visit: Payer: Self-pay | Admitting: Specialist

## 2023-11-26 DIAGNOSIS — F17210 Nicotine dependence, cigarettes, uncomplicated: Secondary | ICD-10-CM

## 2023-12-30 ENCOUNTER — Ambulatory Visit
Admission: RE | Admit: 2023-12-30 | Discharge: 2023-12-30 | Disposition: A | Discharge status: Home / Self Care | Payer: BC Managed Care – PPO | Source: Ambulatory Visit | Attending: Specialist | Admitting: Specialist

## 2023-12-30 DIAGNOSIS — F17210 Nicotine dependence, cigarettes, uncomplicated: Secondary | ICD-10-CM

## 2024-02-07 ENCOUNTER — Other Ambulatory Visit: Payer: Self-pay | Admitting: Emergency Medicine

## 2024-02-07 DIAGNOSIS — R918 Other nonspecific abnormal finding of lung field: Secondary | ICD-10-CM

## 2024-02-11 ENCOUNTER — Other Ambulatory Visit: Payer: Self-pay | Admitting: Emergency Medicine

## 2024-02-11 DIAGNOSIS — R918 Other nonspecific abnormal finding of lung field: Secondary | ICD-10-CM

## 2024-02-24 ENCOUNTER — Other Ambulatory Visit

## 2024-03-02 ENCOUNTER — Ambulatory Visit
Admission: RE | Admit: 2024-03-02 | Discharge: 2024-03-02 | Disposition: A | Discharge status: Home / Self Care | Source: Ambulatory Visit | Attending: Emergency Medicine | Admitting: Emergency Medicine

## 2024-03-02 DIAGNOSIS — R918 Other nonspecific abnormal finding of lung field: Secondary | ICD-10-CM

## 2024-04-23 ENCOUNTER — Other Ambulatory Visit: Payer: Self-pay | Admitting: Specialist

## 2024-04-23 DIAGNOSIS — R918 Other nonspecific abnormal finding of lung field: Secondary | ICD-10-CM

## 2024-05-04 ENCOUNTER — Ambulatory Visit
Admission: RE | Admit: 2024-05-04 | Discharge: 2024-05-04 | Disposition: A | Discharge status: Home / Self Care | Source: Ambulatory Visit | Attending: Specialist | Admitting: Specialist

## 2024-05-04 DIAGNOSIS — I2510 Atherosclerotic heart disease of native coronary artery without angina pectoris: Secondary | ICD-10-CM | POA: Diagnosis not present

## 2024-05-04 DIAGNOSIS — R918 Other nonspecific abnormal finding of lung field: Secondary | ICD-10-CM | POA: Insufficient documentation

## 2024-05-04 DIAGNOSIS — I700 Atherosclerosis of aorta: Secondary | ICD-10-CM | POA: Insufficient documentation

## 2024-05-04 LAB — GLUCOSE, CAPILLARY: Glucose-Capillary: 109 mg/dL — ABNORMAL HIGH (ref 70–99)

## 2024-05-04 MED ORDER — FLUDEOXYGLUCOSE F - 18 (FDG) INJECTION
8.7600 | Freq: Once | INTRAVENOUS | Status: AC | PRN
  Administered 2024-05-04: 8.76 via INTRAVENOUS

## 2024-05-05 ENCOUNTER — Other Ambulatory Visit: Payer: Self-pay | Admitting: Pulmonary Disease

## 2024-05-11 ENCOUNTER — Other Ambulatory Visit: Payer: Self-pay | Admitting: Pulmonary Disease

## 2024-05-11 DIAGNOSIS — R918 Other nonspecific abnormal finding of lung field: Secondary | ICD-10-CM

## 2024-05-14 ENCOUNTER — Encounter
Admission: RE | Admit: 2024-05-14 | Discharge: 2024-05-14 | Disposition: A | Discharge status: Home / Self Care | Source: Ambulatory Visit | Attending: Pulmonary Disease | Admitting: Pulmonary Disease

## 2024-05-14 ENCOUNTER — Other Ambulatory Visit: Payer: Self-pay

## 2024-05-14 VITALS — Ht 68.0 in

## 2024-05-14 DIAGNOSIS — J449 Chronic obstructive pulmonary disease, unspecified: Secondary | ICD-10-CM

## 2024-05-14 DIAGNOSIS — I10 Essential (primary) hypertension: Secondary | ICD-10-CM

## 2024-05-14 DIAGNOSIS — Z01810 Encounter for preprocedural cardiovascular examination: Secondary | ICD-10-CM

## 2024-05-14 DIAGNOSIS — Z01812 Encounter for preprocedural laboratory examination: Secondary | ICD-10-CM

## 2024-05-14 DIAGNOSIS — I2510 Atherosclerotic heart disease of native coronary artery without angina pectoris: Secondary | ICD-10-CM

## 2024-05-14 HISTORY — DX: Atherosclerosis of native arteries of extremities with intermittent claudication, unspecified extremity: I70.219

## 2024-05-14 HISTORY — DX: Atherosclerosis of aorta: I70.0

## 2024-05-14 HISTORY — DX: Unspecified asthma, uncomplicated: J45.909

## 2024-05-14 HISTORY — DX: Tobacco use: Z72.0

## 2024-05-14 HISTORY — DX: Gastro-esophageal reflux disease without esophagitis: K21.9

## 2024-05-14 HISTORY — DX: Other nonspecific abnormal finding of lung field: R91.8

## 2024-05-14 HISTORY — DX: Atherosclerotic heart disease of native coronary artery without angina pectoris: I25.10

## 2024-05-14 HISTORY — DX: Presence of coronary angioplasty implant and graft: Z95.5

## 2024-05-14 HISTORY — DX: Spondylosis without myelopathy or radiculopathy, lumbosacral region: M47.817

## 2024-05-14 NOTE — Patient Instructions (Signed)
 Your procedure is scheduled on:05-22-24 Friday Report to the Registration Desk on the 1st floor of the [facility].Then proceed to the 2nd floor Surgery Desk To find out your arrival time, please call [phone] between 1PM - 3PM on:05-21-24 Thursday If your arrival time is 6:00 am, do not arrive before that time as the [facility] entrance doors do not open until 6:00 am.  REMEMBER: Instructions that are not followed completely may result in serious medical risk, up to and including death; or upon the discretion of your surgeon and anesthesiologist your surgery may need to be rescheduled.  Do not eat food after midnight the night before surgery.  No gum chewing or hard candies.  You may however, drink CLEAR liquids up to 2 hours before you are scheduled to arrive for your surgery. Do not drink anything within 2 hours of your scheduled arrival time.  Clear liquids include: - water  - apple juice without pulp - gatorade (not RED colors) - black coffee or tea (Do NOT add milk or creamers to the coffee or tea) Do NOT drink anything that is not on this list.  One week prior to surgery:Stop NOW (05-14-24) Stop Anti-inflammatories (NSAIDS) such as Advil, Aleve, Ibuprofen, Motrin, Naproxen, Naprosyn and Aspirin  based products such as Excedrin, Goody's Powder, BC Powder. Stop ANY OVER THE COUNTER supplements until after surgery (Multivitamin, Vitamin D3)  You may however, continue to take Tylenol  if needed for pain up until the day of surgery.  Stop clopidogrel (PLAVIX) 5 days prior to surgery-Last dose will be on 05-16-24 Saturday  Continue taking all of your other prescription medications up until the day of surgery.  ON THE DAY OF SURGERY ONLY TAKE THESE MEDICATIONS WITH SIPS OF WATER: -amLODipine (NORVASC)  -metoprolol  succinate (TOPROL -XL)  -omeprazole (PRILOSEC OTC)  -isosorbide mononitrate (IMDUR)   Use your Spiriva Inhaler the day of surgery  Continue your 81 mg Aspirin  up until  the day prior to surgery-Do NOT take the morning of surgery  No Alcohol for 24 hours before or after surgery.  No Smoking including e-cigarettes for 24 hours before surgery.  No chewable tobacco products for at least 6 hours before surgery.  No nicotine  patches on the day of surgery.  Do not use any recreational drugs for at least a week (preferably 2 weeks) before your surgery.  Please be advised that the combination of cocaine and anesthesia may have negative outcomes, up to and including death. If you test positive for cocaine, your surgery will be cancelled.  On the morning of surgery brush your teeth with toothpaste and water, you may rinse your mouth with mouthwash if you wish. Do not swallow any toothpaste or mouthwash.  Do not wear jewelry, make-up, hairpins, clips or nail polish.  For welded (permanent) jewelry: bracelets, anklets, waist bands, etc.  Please have this removed prior to surgery.  If it is not removed, there is a chance that hospital personnel will need to cut it off on the day of surgery.  Do not wear lotions, powders, or perfumes.   Do not shave body hair from the neck down 48 hours before surgery.  Contact lenses, hearing aids and dentures may not be worn into surgery.  Do not bring valuables to the hospital. [facility] is not responsible for any missing/lost belongings or valuables.   Notify your doctor if there is any change in your medical condition (cold, fever, infection).  Wear comfortable clothing (specific to your surgery type) to the hospital.  After surgery, you can help prevent lung complications by doing breathing exercises.  Take deep breaths and cough every 1-2 hours. Your doctor may order a device called an Incentive Spirometer to help you take deep breaths. When coughing or sneezing, hold a pillow firmly against your incision with both hands. This is called "splinting." Doing this helps protect your incision. It also decreases belly  discomfort.  If you are being admitted to the hospital overnight, leave your suitcase in the car. After surgery it may be brought to your room.  In case of increased patient census, it may be necessary for you, the patient, to continue your postoperative care in the Same Day Surgery department.  If you are being discharged the day of surgery, you will not be allowed to drive home. You will need a responsible individual to drive you home and stay with you for 24 hours after surgery.   If you are taking public transportation, you will need to have a responsible individual with you.  Please call the Pre-admissions Testing Dept. at [phone] if you have any questions about these instructions.  Surgery Visitation Policy:  Patients having surgery or a procedure may have two visitors.  Children under the age of 65 must have an adult with them who is not the patient.   Merchandiser, retail to address health-related social needs:  [email or web address]

## 2024-05-15 ENCOUNTER — Encounter
Admission: RE | Admit: 2024-05-15 | Discharge: 2024-05-15 | Disposition: A | Discharge status: Home / Self Care | Source: Ambulatory Visit | Attending: Pulmonary Disease | Admitting: Pulmonary Disease

## 2024-05-15 DIAGNOSIS — Z01812 Encounter for preprocedural laboratory examination: Secondary | ICD-10-CM

## 2024-05-15 DIAGNOSIS — Z01818 Encounter for other preprocedural examination: Secondary | ICD-10-CM | POA: Insufficient documentation

## 2024-05-15 DIAGNOSIS — J449 Chronic obstructive pulmonary disease, unspecified: Secondary | ICD-10-CM | POA: Diagnosis not present

## 2024-05-15 DIAGNOSIS — I2510 Atherosclerotic heart disease of native coronary artery without angina pectoris: Secondary | ICD-10-CM | POA: Insufficient documentation

## 2024-05-15 DIAGNOSIS — I10 Essential (primary) hypertension: Secondary | ICD-10-CM | POA: Diagnosis not present

## 2024-05-15 DIAGNOSIS — Z01810 Encounter for preprocedural cardiovascular examination: Secondary | ICD-10-CM

## 2024-05-15 LAB — BASIC METABOLIC PANEL WITH GFR
Anion gap: 15 (ref 5–15)
BUN: 17 mg/dL (ref 8–23)
CO2: 22 mmol/L (ref 22–32)
Calcium: 9.3 mg/dL (ref 8.9–10.3)
Chloride: 104 mmol/L (ref 98–111)
Creatinine, Ser: 0.76 mg/dL (ref 0.61–1.24)
GFR, Estimated: >60 mL/min (ref >60)
Glucose, Bld: 114 mg/dL — ABNORMAL HIGH (ref 70–99)
Potassium: 3.5 mmol/L (ref 3.5–5.1)
Sodium: 141 mmol/L (ref 135–145)

## 2024-05-15 LAB — CBC
HCT: 48 % (ref 39.0–52.0)
Hemoglobin: 17.1 g/dL — ABNORMAL HIGH (ref 13.0–17.0)
MCH: 31 pg (ref 26.0–34.0)
MCHC: 35.6 g/dL (ref 30.0–36.0)
MCV: 87 fL (ref 80.0–100.0)
Platelets: 211 K/uL (ref 150–400)
RBC: 5.52 MIL/uL (ref 4.22–5.81)
RDW: 11.9 % (ref 11.5–15.5)
WBC: 8.3 K/uL (ref 4.0–10.5)
nRBC: 0 % (ref 0.0–0.2)

## 2024-05-18 ENCOUNTER — Ambulatory Visit
Admission: RE | Admit: 2024-05-18 | Discharge: 2024-05-18 | Disposition: A | Discharge status: Home / Self Care | Source: Ambulatory Visit | Attending: Pulmonary Disease | Admitting: Pulmonary Disease

## 2024-05-18 DIAGNOSIS — R918 Other nonspecific abnormal finding of lung field: Secondary | ICD-10-CM | POA: Diagnosis present

## 2024-05-19 ENCOUNTER — Encounter: Payer: Self-pay | Admitting: Pulmonary Disease

## 2024-05-19 DIAGNOSIS — R0609 Other forms of dyspnea: Secondary | ICD-10-CM

## 2024-05-19 NOTE — Progress Notes (Signed)
 Perioperative / Anesthesia Services  Pre-Admission Testing Clinical Review / Pre-Operative Anesthesia Consult  Date: 05/19/24  PATIENT DEMOGRAPHICS: Name: Dominic Ferguson DOB: July 14, 1961 MRN:   [id number]  Note: Available PAT nursing documentation and vital signs have been reviewed. Clinical nursing staff has updated patient's PMH/PSHx, current medication list, and drug allergies/intolerances to ensure complete and comprehensive history available to assist care teams in MDM as it pertains to the aforementioned surgical procedure and anticipated anesthetic course. Extensive review of available clinical information personally performed.  PMH and PSHx updated with any diagnoses/procedures that  may have been inadvertently omitted during his intake with the pre-admission testing department's nursing staff.  PLANNED SURGICAL PROCEDURE(S):   Case: [id number] Date/Time: 05/22/24 1114   Procedures:      VIDEO BRONCHOSCOPY WITH ENDOBRONCHIAL NAVIGATION     BRONCHOSCOPY, WITH EBUS   Anesthesia type: General   Diagnosis: Lung nodules [R91.8]   Pre-op diagnosis: R91.8 Lung nodules   Location: [facility] PROCEDURE RM 02 / [facility] ORS FOR ANESTHESIA GROUP   Surgeons: Parris Manna, MD        CLINICAL DISCUSSION: Dominic Ferguson is a 63 y.o. male who is submitted for pre-surgical anesthesia review and clearance prior to him undergoing the above procedure. Patient is a Current Smoker. Pertinent PMH includes: CAD, PVD with intermittent claudication, aortic atherosclerosis, HTN, HLD, DOE, COPD, asthma, pulmonary nodules, GERD (on daily PPI), OA, lumbosacral DJD, alcohol and tobacco abuse.  Patient is followed by cardiology Jodeen, MD). He was last seen in the cardiology clinic on 05/03/2024; notes reviewed. At the time of his clinic visit, patient doing well overall from a cardiovascular perspective. Patient denied any chest pain, shortness of breath, PND, orthopnea, palpitations, significant peripheral edema,  weakness, fatigue, vertiginous symptoms, or presyncope/syncope. Patient with a past medical history significant for cardiovascular diagnoses. Documented physical exam was grossly benign, providing no evidence of acute exacerbation and/or decompensation of the patient's known cardiovascular conditions.  Most recent myocardial perfusion imaging study was performed on 09/11/2023 revealing a mildly reduced normal left ventricular systolic function with an EF of 49%.  There was global hypokinesis. No artifact or left ventricular cavity size enlargement appreciated on review of imaging. SPECT images demonstrated a moderate reversible perfusion abnormality present in the inferior septal region.  TID ratio = 1.08.  Invasive evaluation was recommended.  Patient underwent diagnostic LEFT heart catheterization on 10/03/2019 for.  Study revealed multivessel CAD; 99% ostial RCA, 25 ostial LM, and minor luminal irregularities in the LAD and LCx.  PCI was subsequently performed placing 3.5 x 15 mm Onyx Frontier DES x 2 to the ostial RCA lesion (overlapping).  Procedure yielded excellent angiographic result and TIMI-3 flow.  Most recent TTE performed on 10/07/2023 revealed a normal left ventricular systolic function with an EF of >55% %. There were no regional wall motion abnormalities.  Left ventricular diastolic Doppler parameters were normal.  GLS -15.3%. Right ventricular size and function normal with a TAPSE measuring 2.3 cm  (normal range >/= 1.6 cm).  RVSP = 21 mmHg.  There was trivial mitral, tricuspid, and pulmonary valve regurgitation.  All transvalvular gradients were noted to be normal providing no evidence of hemodynamically significant valvular stenosis. Aorta normal in size with no evidence of ectasia or aneurysmal dilatation.  Following stent placement, patient mains on daily DAPT therapy using ASA + clinical.  Patient is reportedly compliant with therapy with no evidence reports of GI/GU related bleeding.  Blood pressure well controlled at 122/60 mmHg on currently prescribed nitrate (isosorbide   mononitrate) and beta-blocker (metoprolol  succinate) therapies.  Patient is on atorvastatin  for his HLD diagnosis and ASCVD prevention. Patient is not diabetic. He does not have an OSAH diagnosis. Patient is able to complete all of his  ADL/IADLs without cardiovascular limitation.  Per the DASI, patient is able to achieve at least 4 METS of physical activity without experiencing any significant degree of angina/anginal equivalent symptoms. No changes were made to his medication regimen during his visit with cardiology.  Patient scheduled to follow-up with outpatient cardiology in 6 months or sooner if needed.  Dominic Ferguson underwent CT imaging of the chest on 12/30/2023 as part of an annual lung screening initiative.  Imaging revealed interval development of multiple BILATERAL, upper pulmonary lobe nodules (R >L).  Largest nodule was in the anteromedial RIGHT upper pulmonary lobe and measured 2.8 mm.  Additionally, there were other nodules of up to 7.7 mm in the LEFT upper pulmonary lobe.  There was prominent, but not pathologically sized, middle mediastinal lymphadenopathy also noted.  Repeat follow-up CT imaging of the chest was performed on 03/02/2024, again revealing multiple BILATERAL upper pulmonary lobe nodules.  Largest nodule in the anteromedial RIGHT upper lobe with noted increase in size now measuring 17.6 mm (previously 10.8 mm) and demonstrating tethering to the adjacent pleura.  Subsequent pet imaging on 05/04/2024 demonstrated hypermetabolism in the anterior segment of the RIGHT upper lobe with a maximum SUV of 3.0.  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  There was an area of hypermetabolism located at the cecum (SUV max 8.1). Patient was referred to pulmonary medicine for further evaluation and discussions regarding tissue sampling for diagnostic purposes.  Patient subsequently  scheduled for VIDEO BRONCHOSCOPY WITH ENDOBRONCHIAL NAVIGATION AND EBUS on 05/22/2024 with Dr. Halina Picking, MD.    Given patient's past medical history significant for cardiovascular diagnoses, presurgical cardiac clearance was sought by the PAT team. Per cardiology, this patient is optimized for surgery and may proceed with the planned procedural course with a MODERATE risk of significant perioperative cardiovascular complications.  Again, this patient is on daily DAPT therapy.  He was instructed on recommendations from cardiology for holding his clopidogrel dose for 5 days prior to his procedure with plans to restart as soon as postoperative bleeding is to be minimized by his primary team and surgeon.  Patient is aware that his last dose of clopidogrel should be on 05/16/2024.  Per recommendations from cardiology, patient to continue his daily low-dose ASA throughout his perioperative course.  Patient denies previous perioperative complications with anesthesia in the past. In review his EMR, there are no records available for review pertaining to any anesthetic courses within the [facility] system in the recent past.   MOST RECENT VITAL SIGNS:    05/14/2024   11:00 AM 05/04/2024    8:33 AM 10/04/2023    8:12 AM  Vitals with BMI  Height 5' 8    Weight  179 lbs   Systolic   122  Diastolic   68  Pulse   66   PROVIDERS/SPECIALISTS: NOTE: Primary physician provider listed below. Patient may have been seen by APP or partner within same practice.   PROVIDER ROLE / SPECIALTY LAST SHERLEAN Picking Halina, MD Pulmonary Medicine (Surgeon) Will see patient in consult prior to procedure  Epifanio Alm SQUIBB, MD Primary Care Provider 01/27/2024  Wilburn Fillers, MD Cardiology 04/27/2024  Theotis Chancellor, MD Pulmonary Medicine 04/06/2024  Avanell Katz, MD Physiatry 12/17/2022  Maree Hila, MD Neurology 01/21/2023  Jama Hacker, MD Vascular Surgery 05/20/2023   ALLERGIES: No Known  Allergies  CURRENT HOME MEDICATIONS: No current facility-administered medications for this encounter.    amLODipine  (NORVASC ) 10 MG tablet   aspirin  81 MG chewable tablet   atorvastatin  (LIPITOR) 40 MG tablet   cholecalciferol  (VITAMIN D3) 25 MCG (1000 UNIT) tablet   clopidogrel (PLAVIX) 75 MG tablet   isosorbide  mononitrate (IMDUR ) 30 MG 24 hr tablet   losartan  (COZAAR ) 25 MG tablet   metoprolol  succinate (TOPROL -XL) 25 MG 24 hr tablet   Multiple Vitamin (MULTIVITAMIN) tablet   omeprazole  (PRILOSEC  OTC) 20 MG tablet   tiotropium (SPIRIVA ) 18 MCG inhalation capsule   HISTORY: Past Medical History:  Diagnosis Date   Aortic atherosclerosis (HCC)    Arthritis    Asthma    Atherosclerosis of native arteries of extremity with intermittent claudication (HCC)    COPD (chronic obstructive pulmonary disease) (HCC)    Coronary artery disease    a.) s/p PCI 10/03/2023: 99% oRCA (3.0 x 15 mm Frontier Onyx DES x 2)   DJD (degenerative joint disease), lumbosacral    DOE (dyspnea on exertion)    GERD (gastroesophageal reflux disease)    HLD (hyperlipidemia)    Hypertension    Long term current use of clopidogrel    Long-term use of aspirin  therapy    Lung nodules    Tobacco abuse    Past Surgical History:  Procedure Laterality Date   CORONARY STENT INTERVENTION N/A 10/03/2023   Procedure: CORONARY STENT INTERVENTION;  Surgeon: Florencio Cara BIRCH, MD;  Location: [facility] INVASIVE CV LAB;  Service: Cardiovascular;  Laterality: N/A;   LEFT HEART CATH AND CORONARY ANGIOGRAPHY Left 10/03/2023   Procedure: LEFT HEART CATH AND CORONARY ANGIOGRAPHY;  Surgeon: Florencio Cara BIRCH, MD;  Location: [facility] INVASIVE CV LAB;  Service: Cardiovascular;  Laterality: Left;   Family History  Problem Relation Age of Onset   Cancer Father    Heart disease Brother    Social History   Tobacco Use   Smoking status: Every Day    Current packs/day: 1.00    Types: Cigarettes   Smokeless tobacco: Never   Substance Use Topics   Alcohol use: Yes    Alcohol/week: 49.0 standard drinks of alcohol    Types: 49 Cans of beer per week    Comment: weekends   LABS:  Hospital Outpatient Visit on 05/15/2024  Component Date Value Ref Range Status   WBC 05/15/2024 8.3  4.0 - 10.5 K/uL Final   RBC 05/15/2024 5.52  4.22 - 5.81 MIL/uL Final   Hemoglobin 05/15/2024 17.1 (H)  13.0 - 17.0 g/dL Final   HCT 91/98/7974 48.0  39.0 - 52.0 % Final   MCV 05/15/2024 87.0  80.0 - 100.0 fL Final   MCH 05/15/2024 31.0  26.0 - 34.0 pg Final   MCHC 05/15/2024 35.6  30.0 - 36.0 g/dL Final   RDW 91/98/7974 11.9  11.5 - 15.5 % Final   Platelets 05/15/2024 211  150 - 400 K/uL Final   nRBC 05/15/2024 0.0  0.0 - 0.2 % Final   Performed at [facility], [address] [place], [place] [address]   Sodium 05/15/2024 141  135 - 145 mmol/L Final   Potassium 05/15/2024 3.5  3.5 - 5.1 mmol/L Final   Chloride 05/15/2024 104  98 - 111 mmol/L Final   CO2 05/15/2024 22  22 - 32 mmol/L Final   Glucose, Bld 05/15/2024 114 (H)  70 - 99 mg/dL Final  Glucose reference range applies only to samples taken after fasting for at least 8 hours.   BUN 05/15/2024 17  8 - 23 mg/dL Final   Creatinine, Ser 05/15/2024 0.76  0.61 - 1.24 mg/dL Final   Calcium  05/15/2024 9.3  8.9 - 10.3 mg/dL Final   GFR, Estimated 05/15/2024 >60  >60 mL/min Final   Comment: (NOTE) Calculated using the CKD-EPI Creatinine Equation (2021)    Anion gap 05/15/2024 15  5 - 15 Final   Performed at [facility], [address] [place], [place] [address]  Hospital Outpatient Visit on 05/04/2024  Component Date Value Ref Range Status   Glucose-Capillary 05/04/2024 109 (H)  70 - 99 mg/dL Final   Glucose reference range applies only to samples taken after fasting for at least 8 hours.    ECG: Date: 05/15/2024  Time ECG obtained: 1129 PM Rate: 69 bpm Rhythm: normal sinus Axis (leads I and aVF): normal Intervals: PR 160 ms. QRS 98 ms. QTc 430  ms. ST segment and T wave changes: No evidence of acute T wave abnormalities or significant ST segment elevation or depression.  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 Cecal hypermetabolism. Malignancy cannot be excluded. Recommend correlation with recent or future colonoscopy. Age advanced three-vessel coronary artery calcification. Aortic atherosclerosis   CT CHEST LCS NODULE F/U LOW DOSE WO CONTRAST performed on 03/02/2024 Lung-RADS 4B, suspicious. Additional imaging evaluation or consultation with Pulmonology or Thoracic Surgery recommended. Increased size of the dominant nodule in the anteromedial right upper lobe, suspicious for malignancy. Remainder of pulmonary nodules are stable to decreased in size. Aortic atherosclerosis  Emphysema Coronary artery calcifications. Assessment for potential risk factor modification, dietary therapy or pharmacologic therapy may be warranted, if clinically indicated.  TRANSTHORACIC ECHOCARDIOGRAM performed on 10/07/2023 Normal left ventricular systolic function with an EF of >55% No LVH No regional wall motion abnormalities Normal right ventricular size and systolic function GLS -15.3% Trivial MR, PR, and TR Aortic valve sclerosis Normal gradients; no valvular stenosis  LEFT HEART CATHETERIZATION AND CORONARY ANGIOGRAPHY performed on 10/03/2023 Normal left ventricular systolic function with EF of 55 to 65% Normal LVEDP Multivessel CAD 99% ostial RCA 25% ostial LM Minor luminal irregularities in the LAD and LCx Successful PCI 3.0 x 15 mm Frontier Onyx DES x 2 to the ostial RCA.  Procedure yielded excellent angiographic result and TIMI-3  flow. Recommendations In the absence of any other complications or medical issues, we expect the patient to be ready for discharge from an interventional cardiology perspective on 10/04/2023. Recommend uninterrupted dual antiplatelet therapy with Aspirin  81mg  daily and Ticagrelor  90mg  twice daily for a minimum of 6 months (stable ischemic heart disease-Class I recommendation).   MYOCARDIAL PERFUSION IMAGING STUDY (LEXISCAN) performed on 09/11/2023 Mildly reduced left ventricular systolic function with an EF of 49% Global hypokinesis Normal left ventricular cavity size SPECT images demonstrate a moderate reversible perfusion abnormality of moderate intensity present in the inferior septal region on stress images. TID ratio = 1.08 Findings consistent with ischemia.  Invasive evaluation recommended.  IMPRESSION AND PLAN: Dominic Ferguson has been referred for pre-anesthesia review and clearance prior to him undergoing the planned anesthetic and procedural courses. Available labs, pertinent testing, and imaging results were personally reviewed by  me in preparation for upcoming operative/procedural course. [facility] medical record has been updated following extensive record review and patient interview with PAT staff.   This patient has been appropriately cleared by cardiology with an overall MODERATE risk of patient experiencing significant perioperative cardiovascular complications. Based on clinical review performed today (05/19/24), barring any significant acute changes in the patient's overall condition, it is anticipated that he will be able to proceed with the planned surgical intervention. Any acute changes in clinical condition may necessitate his procedure being postponed and/or cancelled. Patient will meet with anesthesia team (MD and/or CRNA) on the day of his procedure for preoperative evaluation/assessment. Questions regarding anesthetic course will be fielded at that time.   Pre-surgical  instructions were reviewed with the patient during his PAT appointment, and questions were fielded to satisfaction by PAT clinical staff. He has been instructed on which medications that he will need to hold prior to surgery, as well as the ones that have been deemed safe/appropriate to take on the day of his procedure. As part of the general education provided by PAT, patient made aware both verbally and in writing, that he would need to abstain from the use of any illegal substances during his perioperative course. He was advised that failure to follow the provided instructions could necessitate case cancellation or result in serious perioperative complications up to and including death. Patient encouraged to contact PAT and/or his surgeon's office to discuss any questions or concerns that may arise prior to surgery; verbalized understanding.   Dominic Pereyra, MSN, APRN, FNP-C, CEN [facility]  Perioperative Services Nurse Practitioner Phone: [phone] Fax: [phone] 05/19/24 1:45 PM  NOTE: This note has been prepared using Dragon dictation software. Despite my best ability to proofread, there is always the potential that unintentional transcriptional errors may still occur from this process.

## 2024-05-22 ENCOUNTER — Ambulatory Visit: Payer: Self-pay | Admitting: Urgent Care

## 2024-05-22 ENCOUNTER — Other Ambulatory Visit: Payer: Self-pay

## 2024-05-22 ENCOUNTER — Other Ambulatory Visit (INDEPENDENT_AMBULATORY_CARE_PROVIDER_SITE_OTHER): Payer: Self-pay | Admitting: Vascular Surgery

## 2024-05-22 ENCOUNTER — Inpatient Hospital Stay
Admission: AD | Admit: 2024-05-22 | Discharge: 2024-05-24 | DRG: 201 | Disposition: A | Discharge status: Home / Self Care | Attending: Internal Medicine | Admitting: Internal Medicine

## 2024-05-22 ENCOUNTER — Ambulatory Visit

## 2024-05-22 ENCOUNTER — Encounter
Admission: AD | Disposition: A | Discharge status: Home / Self Care | Payer: Self-pay | Source: Home / Self Care | Attending: Internal Medicine

## 2024-05-22 ENCOUNTER — Encounter: Payer: Self-pay | Admitting: Pulmonary Disease

## 2024-05-22 DIAGNOSIS — M1990 Unspecified osteoarthritis, unspecified site: Secondary | ICD-10-CM | POA: Diagnosis present

## 2024-05-22 DIAGNOSIS — K219 Gastro-esophageal reflux disease without esophagitis: Secondary | ICD-10-CM | POA: Diagnosis present

## 2024-05-22 DIAGNOSIS — R911 Solitary pulmonary nodule: Secondary | ICD-10-CM | POA: Diagnosis present

## 2024-05-22 DIAGNOSIS — M47817 Spondylosis without myelopathy or radiculopathy, lumbosacral region: Secondary | ICD-10-CM | POA: Diagnosis present

## 2024-05-22 DIAGNOSIS — J439 Emphysema, unspecified: Secondary | ICD-10-CM | POA: Diagnosis present

## 2024-05-22 DIAGNOSIS — F17210 Nicotine dependence, cigarettes, uncomplicated: Secondary | ICD-10-CM | POA: Diagnosis present

## 2024-05-22 DIAGNOSIS — Z7902 Long term (current) use of antithrombotics/antiplatelets: Secondary | ICD-10-CM

## 2024-05-22 DIAGNOSIS — J939 Pneumothorax, unspecified: Secondary | ICD-10-CM | POA: Diagnosis not present

## 2024-05-22 DIAGNOSIS — I700 Atherosclerosis of aorta: Secondary | ICD-10-CM | POA: Diagnosis present

## 2024-05-22 DIAGNOSIS — J45909 Unspecified asthma, uncomplicated: Secondary | ICD-10-CM | POA: Diagnosis present

## 2024-05-22 DIAGNOSIS — J95811 Postprocedural pneumothorax: Principal | ICD-10-CM | POA: Diagnosis present

## 2024-05-22 DIAGNOSIS — Z79899 Other long term (current) drug therapy: Secondary | ICD-10-CM

## 2024-05-22 DIAGNOSIS — I70213 Atherosclerosis of native arteries of extremities with intermittent claudication, bilateral legs: Secondary | ICD-10-CM

## 2024-05-22 DIAGNOSIS — R0609 Other forms of dyspnea: Secondary | ICD-10-CM

## 2024-05-22 DIAGNOSIS — Z955 Presence of coronary angioplasty implant and graft: Secondary | ICD-10-CM

## 2024-05-22 DIAGNOSIS — Z8249 Family history of ischemic heart disease and other diseases of the circulatory system: Secondary | ICD-10-CM

## 2024-05-22 DIAGNOSIS — Y838 Other surgical procedures as the cause of abnormal reaction of the patient, or of later complication, without mention of misadventure at the time of the procedure: Secondary | ICD-10-CM | POA: Diagnosis present

## 2024-05-22 DIAGNOSIS — J4489 Other specified chronic obstructive pulmonary disease: Secondary | ICD-10-CM | POA: Diagnosis present

## 2024-05-22 DIAGNOSIS — Z7982 Long term (current) use of aspirin: Secondary | ICD-10-CM

## 2024-05-22 DIAGNOSIS — F419 Anxiety disorder, unspecified: Secondary | ICD-10-CM | POA: Diagnosis present

## 2024-05-22 DIAGNOSIS — E785 Hyperlipidemia, unspecified: Secondary | ICD-10-CM | POA: Diagnosis present

## 2024-05-22 DIAGNOSIS — R590 Localized enlarged lymph nodes: Secondary | ICD-10-CM | POA: Diagnosis present

## 2024-05-22 DIAGNOSIS — I2510 Atherosclerotic heart disease of native coronary artery without angina pectoris: Secondary | ICD-10-CM | POA: Diagnosis present

## 2024-05-22 DIAGNOSIS — I10 Essential (primary) hypertension: Secondary | ICD-10-CM | POA: Diagnosis present

## 2024-05-22 HISTORY — DX: Other forms of dyspnea: R06.09

## 2024-05-22 HISTORY — DX: Long term (current) use of antithrombotics/antiplatelets: Z79.02

## 2024-05-22 HISTORY — DX: Long term (current) use of aspirin: Z79.82

## 2024-05-22 HISTORY — DX: Alcohol abuse, uncomplicated: F10.10

## 2024-05-22 HISTORY — PX: VIDEO BRONCHOSCOPY WITH ENDOBRONCHIAL ULTRASOUND: SHX6177

## 2024-05-22 HISTORY — PX: VIDEO BRONCHOSCOPY WITH ENDOBRONCHIAL NAVIGATION: SHX6175

## 2024-05-22 HISTORY — DX: Hyperlipidemia, unspecified: E78.5

## 2024-05-22 LAB — COMPREHENSIVE METABOLIC PANEL WITH GFR
ALT: 18 U/L (ref 0–44)
AST: 23 U/L (ref 15–41)
Albumin: 4.6 g/dL (ref 3.5–5.0)
Alkaline Phosphatase: 76 U/L (ref 38–126)
Anion gap: 11 (ref 5–15)
BUN: 19 mg/dL (ref 8–23)
CO2: 23 mmol/L (ref 22–32)
Calcium: 9.2 mg/dL (ref 8.9–10.3)
Chloride: 105 mmol/L (ref 98–111)
Creatinine, Ser: 0.82 mg/dL (ref 0.61–1.24)
GFR, Estimated: >60 mL/min (ref >60)
Glucose, Bld: 144 mg/dL — ABNORMAL HIGH (ref 70–99)
Potassium: 3.7 mmol/L (ref 3.5–5.1)
Sodium: 139 mmol/L (ref 135–145)
Total Bilirubin: 1.1 mg/dL (ref 0.0–1.2)
Total Protein: 7.2 g/dL (ref 6.5–8.1)

## 2024-05-22 LAB — CBC WITH DIFFERENTIAL/PLATELET
Abs Immature Granulocytes: 0.08 K/uL — ABNORMAL HIGH (ref 0.00–0.07)
Basophils Absolute: 0 K/uL (ref 0.0–0.1)
Basophils Relative: 0 %
Eosinophils Absolute: 0 K/uL (ref 0.0–0.5)
Eosinophils Relative: 0 %
HCT: 46.9 % (ref 39.0–52.0)
Hemoglobin: 16.5 g/dL (ref 13.0–17.0)
Immature Granulocytes: 1 %
Lymphocytes Relative: 10 %
Lymphs Abs: 1.1 K/uL (ref 0.7–4.0)
MCH: 31.1 pg (ref 26.0–34.0)
MCHC: 35.2 g/dL (ref 30.0–36.0)
MCV: 88.5 fL (ref 80.0–100.0)
Monocytes Absolute: 0.1 K/uL (ref 0.1–1.0)
Monocytes Relative: 1 %
Neutro Abs: 9.6 K/uL — ABNORMAL HIGH (ref 1.7–7.7)
Neutrophils Relative %: 88 %
Platelets: 200 K/uL (ref 150–400)
RBC: 5.3 MIL/uL (ref 4.22–5.81)
RDW: 11.9 % (ref 11.5–15.5)
WBC: 10.9 K/uL — ABNORMAL HIGH (ref 4.0–10.5)
nRBC: 0 % (ref 0.0–0.2)

## 2024-05-22 SURGERY — VIDEO BRONCHOSCOPY WITH ENDOBRONCHIAL NAVIGATION
Anesthesia: General

## 2024-05-22 MED ORDER — FENTANYL CITRATE (PF) 100 MCG/2ML IJ SOLN
INTRAMUSCULAR | Status: AC
  Filled 2024-05-22: qty 2

## 2024-05-22 MED ORDER — SENNOSIDES-DOCUSATE SODIUM 8.6-50 MG PO TABS: 1.0000 | ORAL_TABLET | Freq: Every evening | ORAL | Status: DC | PRN

## 2024-05-22 MED ORDER — ACETAMINOPHEN 650 MG RE SUPP: 650.0000 mg | Freq: Four times a day (QID) | RECTAL | Status: DC | PRN

## 2024-05-22 MED ORDER — ONDANSETRON HCL 4 MG/2ML IJ SOLN
INTRAMUSCULAR | Status: AC
  Filled 2024-05-22: qty 2

## 2024-05-22 MED ORDER — ACETAMINOPHEN 325 MG PO TABS
650.0000 mg | ORAL_TABLET | Freq: Four times a day (QID) | ORAL | Status: DC | PRN
  Administered 2024-05-23: 650 mg via ORAL
  Filled 2024-05-22: qty 2

## 2024-05-22 MED ORDER — ASPIRIN 81 MG PO CHEW
81.0000 mg | CHEWABLE_TABLET | Freq: Every day | ORAL | Status: DC
  Administered 2024-05-23 – 2024-05-24 (×2): 81 mg via ORAL
  Filled 2024-05-22 (×2): qty 1

## 2024-05-22 MED ORDER — OXYCODONE HCL 5 MG PO TABS: 5.0000 mg | ORAL_TABLET | Freq: Four times a day (QID) | ORAL | Status: AC | PRN

## 2024-05-22 MED ORDER — MIDAZOLAM HCL 2 MG/2ML IJ SOLN
INTRAMUSCULAR | Status: DC | PRN
Start: 2024-05-22 — End: 2024-05-22
  Administered 2024-05-22: 2 mg via INTRAVENOUS

## 2024-05-22 MED ORDER — FENTANYL CITRATE (PF) 100 MCG/2ML IJ SOLN
INTRAMUSCULAR | Status: DC | PRN
  Administered 2024-05-22 (×2): 50 ug via INTRAVENOUS

## 2024-05-22 MED ORDER — SUGAMMADEX SODIUM 200 MG/2ML IV SOLN
INTRAVENOUS | Status: DC | PRN
  Administered 2024-05-22: 200 mg via INTRAVENOUS

## 2024-05-22 MED ORDER — NICOTINE 21 MG/24HR TD PT24
21.0000 mg | MEDICATED_PATCH | Freq: Every day | TRANSDERMAL | Status: DC | PRN
  Administered 2024-05-22 – 2024-05-23 (×2): 21 mg via TRANSDERMAL
  Filled 2024-05-22 (×2): qty 1

## 2024-05-22 MED ORDER — AMLODIPINE BESYLATE 10 MG PO TABS: 10.0000 mg | ORAL_TABLET | Freq: Every day | ORAL | Status: DC

## 2024-05-22 MED ORDER — PANTOPRAZOLE SODIUM 40 MG PO TBEC
40.0000 mg | DELAYED_RELEASE_TABLET | Freq: Every day | ORAL | Status: DC
  Administered 2024-05-23: 40 mg via ORAL
  Filled 2024-05-22: qty 1

## 2024-05-22 MED ORDER — LOSARTAN POTASSIUM 25 MG PO TABS: 25.0000 mg | ORAL_TABLET | Freq: Every day | ORAL | Status: DC

## 2024-05-22 MED ORDER — ALBUTEROL SULFATE (2.5 MG/3ML) 0.083% IN NEBU: 2.5000 mg | INHALATION_SOLUTION | Freq: Four times a day (QID) | RESPIRATORY_TRACT | Status: DC | PRN

## 2024-05-22 MED ORDER — ONDANSETRON HCL 4 MG/2ML IJ SOLN: 4.0000 mg | Freq: Once | INTRAMUSCULAR | Status: DC | PRN

## 2024-05-22 MED ORDER — OXYCODONE HCL 5 MG/5ML PO SOLN: 5.0000 mg | Freq: Once | ORAL | Status: AC | PRN

## 2024-05-22 MED ORDER — ISOSORBIDE MONONITRATE ER 30 MG PO TB24
30.0000 mg | ORAL_TABLET | Freq: Every day | ORAL | Status: DC
Start: 2024-05-23 — End: 2024-05-24

## 2024-05-22 MED ORDER — OMEPRAZOLE MAGNESIUM 20 MG PO TBEC: 20.0000 mg | DELAYED_RELEASE_TABLET | Freq: Every day | ORAL | Status: DC

## 2024-05-22 MED ORDER — ATORVASTATIN CALCIUM 20 MG PO TABS
40.0000 mg | ORAL_TABLET | Freq: Every day | ORAL | Status: DC
Start: 2024-05-23 — End: 2024-05-24
  Administered 2024-05-23: 40 mg via ORAL
  Filled 2024-05-22: qty 2

## 2024-05-22 MED ORDER — METOPROLOL SUCCINATE ER 25 MG PO TB24
12.5000 mg | ORAL_TABLET | Freq: Every day | ORAL | Status: DC
  Administered 2024-05-23: 12.5 mg via ORAL
  Filled 2024-05-22: qty 1

## 2024-05-22 MED ORDER — PROPOFOL 10 MG/ML IV BOLUS
INTRAVENOUS | Status: DC | PRN
  Administered 2024-05-22: 130 mg via INTRAVENOUS

## 2024-05-22 MED ORDER — CHLORHEXIDINE GLUCONATE 0.12 % MT SOLN
15.0000 mL | Freq: Once | OROMUCOSAL | Status: AC
  Administered 2024-05-22: 15 mL via OROMUCOSAL

## 2024-05-22 MED ORDER — LIDOCAINE HCL (CARDIAC) PF 100 MG/5ML IV SOSY
PREFILLED_SYRINGE | INTRAVENOUS | Status: DC | PRN
  Administered 2024-05-22: 40 mg via INTRAVENOUS

## 2024-05-22 MED ORDER — ORAL CARE MOUTH RINSE: 15.0000 mL | Freq: Once | OROMUCOSAL | Status: AC

## 2024-05-22 MED ORDER — PHENYLEPHRINE 80 MCG/ML (10ML) SYRINGE FOR IV PUSH (FOR BLOOD PRESSURE SUPPORT)
PREFILLED_SYRINGE | INTRAVENOUS | Status: DC | PRN
Start: 2024-05-22 — End: 2024-05-22
  Administered 2024-05-22: 80 ug via INTRAVENOUS

## 2024-05-22 MED ORDER — ONDANSETRON HCL 4 MG/2ML IJ SOLN
INTRAMUSCULAR | Status: DC | PRN
  Administered 2024-05-22: 4 mg via INTRAVENOUS

## 2024-05-22 MED ORDER — DEXAMETHASONE SODIUM PHOSPHATE 10 MG/ML IJ SOLN
INTRAMUSCULAR | Status: AC
  Filled 2024-05-22: qty 1

## 2024-05-22 MED ORDER — OXYCODONE HCL 5 MG PO TABS
ORAL_TABLET | ORAL | Status: AC
Start: 2024-05-22 — End: 2024-05-22
  Filled 2024-05-22: qty 1

## 2024-05-22 MED ORDER — PROPOFOL 10 MG/ML IV BOLUS
INTRAVENOUS | Status: AC
  Filled 2024-05-22: qty 20

## 2024-05-22 MED ORDER — TIOTROPIUM BROMIDE MONOHYDRATE 18 MCG IN CAPS: 18.0000 ug | ORAL_CAPSULE | Freq: Every day | RESPIRATORY_TRACT | Status: DC

## 2024-05-22 MED ORDER — ONDANSETRON HCL 4 MG PO TABS: 4.0000 mg | ORAL_TABLET | Freq: Four times a day (QID) | ORAL | Status: DC | PRN

## 2024-05-22 MED ORDER — OXYCODONE HCL 5 MG PO TABS
5.0000 mg | ORAL_TABLET | Freq: Once | ORAL | Status: AC | PRN
  Administered 2024-05-22: 5 mg via ORAL

## 2024-05-22 MED ORDER — CHLORHEXIDINE GLUCONATE 0.12 % MT SOLN
OROMUCOSAL | Status: AC
Start: 2024-05-22 — End: 2024-05-22
  Filled 2024-05-22: qty 15

## 2024-05-22 MED ORDER — HYDRALAZINE HCL 20 MG/ML IJ SOLN: 5.0000 mg | Freq: Four times a day (QID) | INTRAMUSCULAR | Status: DC | PRN

## 2024-05-22 MED ORDER — MIDAZOLAM HCL 2 MG/2ML IJ SOLN
INTRAMUSCULAR | Status: AC
  Filled 2024-05-22: qty 2

## 2024-05-22 MED ORDER — FENTANYL CITRATE (PF) 100 MCG/2ML IJ SOLN
25.0000 ug | INTRAMUSCULAR | Status: AC | PRN
  Administered 2024-05-22 (×8): 25 ug via INTRAVENOUS

## 2024-05-22 MED ORDER — ROCURONIUM BROMIDE 100 MG/10ML IV SOLN
INTRAVENOUS | Status: DC | PRN
Start: 2024-05-22 — End: 2024-05-22
  Administered 2024-05-22: 20 mg via INTRAVENOUS
  Administered 2024-05-22: 50 mg via INTRAVENOUS

## 2024-05-22 MED ORDER — FENTANYL CITRATE PF 50 MCG/ML IJ SOSY
25.0000 ug | PREFILLED_SYRINGE | INTRAMUSCULAR | Status: DC | PRN
  Administered 2024-05-22: 25 ug via INTRAVENOUS
  Filled 2024-05-22 (×2): qty 1

## 2024-05-22 MED ORDER — UMECLIDINIUM BROMIDE 62.5 MCG/ACT IN AEPB
1.0000 | INHALATION_SPRAY | Freq: Every day | RESPIRATORY_TRACT | Status: DC
  Administered 2024-05-23 – 2024-05-24 (×2): 1 via RESPIRATORY_TRACT
  Filled 2024-05-22: qty 7

## 2024-05-22 MED ORDER — LACTATED RINGERS IV SOLN: INTRAVENOUS | Status: DC

## 2024-05-22 MED ORDER — ONDANSETRON HCL 4 MG/2ML IJ SOLN: 4.0000 mg | Freq: Four times a day (QID) | INTRAMUSCULAR | Status: DC | PRN

## 2024-05-22 MED ORDER — DEXAMETHASONE SODIUM PHOSPHATE 10 MG/ML IJ SOLN
INTRAMUSCULAR | Status: DC | PRN
Start: 2024-05-22 — End: 2024-05-22
  Administered 2024-05-22: 5 mg via INTRAVENOUS

## 2024-05-22 MED ORDER — HEPARIN SODIUM (PORCINE) 5000 UNIT/ML IJ SOLN
5000.0000 [IU] | Freq: Three times a day (TID) | INTRAMUSCULAR | Status: DC
  Administered 2024-05-22 – 2024-05-24 (×5): 5000 [IU] via SUBCUTANEOUS
  Filled 2024-05-22 (×5): qty 1

## 2024-05-22 MED ORDER — EPHEDRINE SULFATE-NACL 50-0.9 MG/10ML-% IV SOSY
PREFILLED_SYRINGE | INTRAVENOUS | Status: DC | PRN
  Administered 2024-05-22: 10 mg via INTRAVENOUS

## 2024-05-22 MED ORDER — MORPHINE SULFATE (PF) 4 MG/ML IV SOLN
4.0000 mg | INTRAVENOUS | Status: AC | PRN
  Administered 2024-05-22 – 2024-05-23 (×2): 4 mg via INTRAVENOUS
  Filled 2024-05-22 (×2): qty 1

## 2024-05-22 NOTE — Assessment & Plan Note (Signed)
 Albuterol as needed

## 2024-05-22 NOTE — Anesthesia Preprocedure Evaluation (Signed)
 Anesthesia Evaluation  Patient identified by MRN, date of birth, ID band Patient awake    Reviewed: Allergy & Precautions, NPO status , Patient's Chart, lab work & pertinent test results  Airway Mallampati: II  TM Distance: >3 FB Neck ROM: full    Dental  (+) Edentulous Upper, Missing, Poor Dentition   Pulmonary neg pulmonary ROS, COPD,  COPD inhaler, Current Smoker and Patient abstained from smoking.   Pulmonary exam normal  + decreased breath sounds      Cardiovascular Exercise Tolerance: Good hypertension, Pt. on medications + CAD, + Cardiac Stents and + DOE  negative cardio ROS Normal cardiovascular exam Rhythm:Regular Rate:Normal     Neuro/Psych   Anxiety     negative neurological ROS  negative psych ROS   GI/Hepatic negative GI ROS, Neg liver ROS,GERD  Medicated,,  Endo/Other  negative endocrine ROS  Class 3 obesity  Renal/GU negative Renal ROS     Musculoskeletal  (+) Arthritis ,    Abdominal   Peds negative pediatric ROS (+)  Hematology negative hematology ROS (+)   Anesthesia Other Findings Past Medical History: No date: Alcohol abuse No date: Aortic atherosclerosis (HCC) No date: Arthritis No date: Asthma No date: Atherosclerosis of native arteries of extremity with  intermittent claudication (HCC) No date: COPD (chronic obstructive pulmonary disease) (HCC) No date: Coronary artery disease     Comment:  a.) s/p PCI 10/03/2023: 99% oRCA (3.0 x 15 mm Frontier               Onyx DES x 2) No date: DJD (degenerative joint disease), lumbosacral No date: DOE (dyspnea on exertion) No date: GERD (gastroesophageal reflux disease) No date: HLD (hyperlipidemia) No date: Hypertension No date: Long term current use of clopidogrel No date: Long-term use of aspirin  therapy No date: Lung nodules No date: Tobacco abuse  Past Surgical History: 10/03/2023: CORONARY STENT INTERVENTION; N/A     Comment:   Procedure: CORONARY STENT INTERVENTION;  Surgeon:               Florencio Cara BIRCH, MD;  Location: [facility] INVASIVE CV LAB;               Service: Cardiovascular;  Laterality: N/A; 10/03/2023: LEFT HEART CATH AND CORONARY ANGIOGRAPHY; Left     Comment:  Procedure: LEFT HEART CATH AND CORONARY ANGIOGRAPHY;                Surgeon: Florencio Cara BIRCH, MD;  Location: [facility] INVASIVE              CV LAB;  Service: Cardiovascular;  Laterality: Left;  BMI    Body Mass Index: 27.22 kg/m      Reproductive/Obstetrics negative OB ROS                              Anesthesia Physical Anesthesia Plan  ASA: 3  Anesthesia Plan: General   Post-op Pain Management:    Induction: Intravenous  PONV Risk Score and Plan: Ondansetron , Dexamethasone , Midazolam  and Treatment may vary due to age or medical condition  Airway Management Planned: Oral ETT  Additional Equipment:   Intra-op Plan:   Post-operative Plan: Extubation in OR  Informed Consent: I have reviewed the patients History and Physical, chart, labs and discussed the procedure including the risks, benefits and alternatives for the proposed anesthesia with the patient or authorized representative who has indicated his/her understanding and acceptance.     Dental  Advisory Given  Plan Discussed with: CRNA  Anesthesia Plan Comments:         Anesthesia Quick Evaluation

## 2024-05-22 NOTE — Procedures (Signed)
 ROBOTIC NAVIGATIONAL BRONCHOSCOPY PROCEDURE NOTE  FIBEROPTIC BRONCHOSCOPY WITH BRONCHOALVEOLAR LAVAGE PROCEDURE NOTE  THERAPEUTIC ASPIRATION OF TRACHEOBRONCHIAL TREE  ENDOBRONCHIAL ULTRASOUND   TRANSBRONCHIAL FNA X 1 LOBE  TRANSBRONCHIAL SURGICAL LUNG BIOPSY X 1 LOBE  RADIAL ENDOBRONCHIAL ULTRASOUND  TRANSBRONCHIAL CYTOPATHOLOGY BRUSHINGS    Flexible bronchoscopy was performed  by : Parris MD  assistance by : 1)Repiratory therapist  and 2)cytotech staff and 3) Anesthesia team and 4) Flouroscopy team and 5) IT supporting staff for robotic platform   Indication for the procedure was :  Pre-procedural H&P. The following assessment was performed on the day of the procedure prior to initiating sedation History:  Chest pain n Dyspnea y Hemoptysis n Cough y Fever n Other pertinent items n  Examination Vital signs -reviewed as per nursing documentation today Cardiac    Murmurs: n  Rubs : n  Gallop: n Lungs Wheezing: n Rales : n Rhonchi :y  Other pertinent findings: SOB/hypoxemia due to chronic lung disease   Pre-procedural assessment for Procedural Sedation included: Depth of sedation: As per anesthesia team  ASA Classification:  2 Mallampati airway assessment: 3    Medication list reviewed: y  The patient's interval history was taken and revealed: no new complaints The pre- procedure physical examination revealed: No new findings Refer to prior clinic note for details.  Informed Consent: Informed consent was obtained from:  patient after explanation of procedure and risks, benefits, as well as alternative procedures available.  Explanation of level of sedation and possible transfusion was also provided.    Procedural Preparation: Time out was performed and patient was identified by name and birthdate and procedure to be performed and side for sampling, if any, was specified. Pt was intubated by anesthesia.  The patient was appropriately  draped.   Fiberoptic bronchoscopy with airway inspection, therapeutic aspiration of tracheobronchial tree and BAL Procedure findings:  Bronchoscope was inserted via ETT  without difficulty.  Posterior oropharynx, epiglottis, arytenoids, false cords and vocal cords were not visualized as these were bypassed by endotracheal tube. The distal trachea was normal in circumference and appearance without mucosal, cartilaginous or branching abnormalities.  The main carina was mildly splayed . All right and left lobar airways were NOT visualized to the Subsegmental level due to obstructed airways from phlegm and mucus  plugging. Mucus plugging with partial to complete airway obstruction was noted bilaterally and treated with therapeutic aspiration prior to beginning of robotic bronchoscopy to allow adequate visualization and accurate lung biopsy and improve patients respiratory status.   The mucosa was : friable at target segment  Airways were notable for:        exophytic lesions :n       extrinsic compression in the following distributions: n.       Friable mucosa: y       Teacher, music /pigmentation: n     Post procedure Diagnosis:   Mucus plugging of tracheobronchial tree      Navigational Bronchoscopy Procedure Findings:   Ion robotic platform utilized for this procedure. Post appropriate planning and registration peripheral navigation was used to visualize target lesion.   Radial endobronchial US  technology utilized during this procedure for confirmation of lesion.  3D Fluoroscopy utilized for tool in lesion confirmation    Target 1 - Transbronchial cytobrushing x RUL x 1 ,   Transbronchial FNA x 3,   Transbronchial surgical pathology x 3,    BAL was performed at this location and sent for processing with cytology and microbiology  Post procedure  diagnosis:  lung cancer      Endobronchial ultrasound assisted hilar and mediastinal lymph node biopsies procedure findings: The  fiberoptic bronchoscope was removed and the EBUS scope was introduced. Examination began to evaluate for pathologically enlarged lymph nodes starting on the left  side progressing to the right side.  All lymph node biopsies performed with 21g  needle. Lymph node biopsies were sent in cytolite for all stations.  Station 10L - 5mm not biopsied Station 7 - 6mm not biopsied Station 10R - 5mm not biopsied Station 4R - 5mm not biopsied  Post procedure diagnosis:  no lymphadenopathy detected no lymph node biopsies were performed      Immediate sampling complications included:NONE immediate   Epinephrine ZERO ml was used topically  The bronchoscopy was terminated due to completion of the planned procedure and the bronchoscope was removed.   Total dosage of Lidocaine  was ZERO mg  Estimated Blood loss: EXPECTED < 5cc.  Complications included:  NONE   Preliminary CXR findings :     Noted pneumothroax on CXR and placed 36F pigtail catheter in OR prior to recovery of patient   Disposition: overnight in hospital due to pneumothorax and plan to remove within 24h and dc home.  Reviewed with wife and made arrangements for follow up.   Follow up with Dr. Tawni Melkonian in 5 days for result discussion.     Halina Picking MD  [facility] Division of Pulmonary & Critical Care Medicine

## 2024-05-22 NOTE — Assessment & Plan Note (Signed)
 Albuterol  nebulizer as needed for wheezing and shortness of breath

## 2024-05-22 NOTE — Assessment & Plan Note (Addendum)
 Post bronchoscopy Status post chest tube placement by pulmonology Pulmonology will continue to follow Symptomatic support: Oxycodone  5 mg p.o. every 6 hours as needed for moderate pain, 1 day ordered; morphine  4 mg IV every 4 hours as needed for severe pain, 20 hours of coverage ordered; fentanyl  25 mcg IV every 4 hours as needed for severe pain not relieved with IV morphine , 20 hours of coverage ordered

## 2024-05-22 NOTE — Procedures (Signed)
 Chest Tube Procedure Note   INDICATION:  Right pneumothorax PROCEDURE OPERATOR: Valri ATTENDING PHYSICIAN: Aleskerrov   CONSENT:   Consent was emergent and not obtained from wife prior to the procedure.     PROCEDURE SUMMARY:   A time out was performed and after the chest x-ray was reviewed, the appropriate side was confirmed and marked. My hands were washed immediately prior to the procedure. I wore a surgical mask with protective eyewear, sterile gown and sterile gloves throughout the procedure. The patient was prepped and draped in a sterile manner using chlorhexidine  scrub after the patient was positioned in the usual fashion.  A 2 cm incision was then made parallel to the rib in the midaxillary line at the level of the 4th right rib. The subcutaneous tissue superficial and superior to the rib was dissected bluntly to the level of the pleura. The pleura was then via seldinger techinque. Swish of air was noted from the pleural space. The disruption in the parietal pleura was entered via wire guide and dilator.  A 14French chest tube was then inserted using seldinger technique. The chest tube was directed anteriorly and inserted easily. The chest tube was sutured to the skin at the insertion site, and connected securely with tape to a pleurovac. A sterile occlusive dressing was placed over the insertion site. No immediate complications were noted. A post-procedure chest x-ray is pending at the time of this note.  Estimated blood loss is <1cc.    Bryor Rami, M.D.  Pulmonary & Critical Care Medicine  [facility]

## 2024-05-22 NOTE — Assessment & Plan Note (Signed)
 Home amlodipine  10 mg daily, Imdur  30 mg daily, losartan  25 mg daily, metoprolol  succinate 12.5 mg daily resumed

## 2024-05-22 NOTE — H&P (Addendum)
 History and Physical   Dominic Ferguson:[id number] DOB: 1961-06-25 DOA: 05/22/2024  PCP: Epifanio Alm SQUIBB, MD  Outpatient Specialists: Dr. Theotis, pulmonology Patient coming from: Direct admission  I have personally briefly reviewed patient's old medical records in [facility] EMR.  Chief Concern: Pneumothorax  HPI: Mr. Dominic Ferguson is a 63 year old male with history of alcohol abuse, osteoarthritis, tobacco smoking, COPD, dyslipidemia, dyspnea, who is status post outpatient bronchoscopy in setting of right upper lobe lung nodule.  Hospitalist was consulted for pneumothorax status post chest tube placement.  Vitals at the time of consultation showed t 98.9, respiration 14, heart rate 72, blood pressure 112/69, SpO2 of 93% on 2 L nasal cannula.  Serum sodium is 139, potassium 3.7, chloride 105, bicarb 23, BUN of 19, serum creatinine 0.82, EGFR greater than 60, nonfasting blood glucose 144, WBC 10.9, hemoglobin 16.5, platelets of 200.  Chest tube was placed post bronchoscopy.  Hospitalist service was consulted by pulmonologist for pneumothorax. ------------------------------------- At bedside, patient able to tell me his first and last name, age, location, current calendar year.  He denies chest pain, shortness of breath, abdominal pain, dysuria, hematuria, diarrhea.  He reports the chest tube is hurting him.  He reports currently his pain is under control.  Social history: He endorses tobacco use.  He infrequently uses EtOH.  He denies recreational drug use.  He currently works as a Estate agent.  ROS: Constitutional: no weight change, no fever ENT/Mouth: no sore throat, no rhinorrhea Eyes: no eye pain, no vision changes Cardiovascular: no chest discomfort at the site of chest tube, no dyspnea,  no edema, no palpitations Respiratory: no cough, no sputum, no wheezing Gastrointestinal: no nausea, no vomiting, no diarrhea, no constipation Genitourinary: no urinary incontinence, no  dysuria, no hematuria Musculoskeletal: no arthralgias, no myalgias Skin: no skin lesions, no pruritus, Neuro: + weakness, no loss of consciousness, no syncope Psych: no anxiety, no depression, no decrease appetite Heme/Lymph: no bruising, no bleeding  Assessment/Plan  Principal Problem:   Pneumothorax on right Active Problems:   Asthma   DJD (degenerative joint disease), lumbosacral   DOE (dyspnea on exertion)   Essential hypertension   Hyperlipidemia   Assessment and Plan:  * Pneumothorax on right Post bronchoscopy Status post chest tube placement by pulmonology Pulmonology will continue to follow Symptomatic support: Oxycodone  5 mg p.o. every 6 hours as needed for moderate pain, 1 day ordered; morphine  4 mg IV every 4 hours as needed for severe pain, 20 hours of coverage ordered; fentanyl  25 mcg IV every 4 hours as needed for severe pain not relieved with IV morphine , 20 hours of coverage ordered  Hyperlipidemia Atorvastatin  40 mg daily resume  Essential hypertension Home amlodipine  10 mg daily, Imdur  30 mg daily, losartan  25 mg daily, metoprolol  succinate 12.5 mg daily resumed  DOE (dyspnea on exertion) Albuterol  nebulizer as needed for wheezing and shortness of breath  Asthma Albuterol  as needed  Chart reviewed.   DVT prophylaxis: Heparin  5000 units subcutaneous every 8 hours Code Status: Full code Diet: Heart healthy Family Communication: Phone call was offered, patient declined Disposition Plan: Pending clinical course Consults called: Pulmonology Admission status: Telemetry surgical, observation  Past Medical History:  Diagnosis Date   Alcohol abuse    Aortic atherosclerosis (HCC)    Arthritis    Asthma    Atherosclerosis of native arteries of extremity with intermittent claudication (HCC)    COPD (chronic obstructive pulmonary disease) (HCC)    Coronary artery disease    a.)  s/p PCI 10/03/2023: 99% oRCA (3.0 x 15 mm Frontier Onyx DES x 2)   DJD  (degenerative joint disease), lumbosacral    DOE (dyspnea on exertion)    GERD (gastroesophageal reflux disease)    HLD (hyperlipidemia)    Hypertension    Long term current use of clopidogrel    Long-term use of aspirin  therapy    Lung nodules    Tobacco abuse    Past Surgical History:  Procedure Laterality Date   CORONARY STENT INTERVENTION N/A 10/03/2023   Procedure: CORONARY STENT INTERVENTION;  Surgeon: Florencio Cara BIRCH, MD;  Location: [facility] INVASIVE CV LAB;  Service: Cardiovascular;  Laterality: N/A;   LEFT HEART CATH AND CORONARY ANGIOGRAPHY Left 10/03/2023   Procedure: LEFT HEART CATH AND CORONARY ANGIOGRAPHY;  Surgeon: Florencio Cara BIRCH, MD;  Location: [facility] INVASIVE CV LAB;  Service: Cardiovascular;  Laterality: Left;    Social History:  reports that he has been smoking cigarettes. He has never used smokeless tobacco. He reports current alcohol use of about 49.0 standard drinks of alcohol per week. He reports that he does not use drugs.  No Known Allergies Family History  Problem Relation Age of Onset   Cancer Father    Heart disease Brother    Family history: Family history reviewed and not pertinent.  Prior to Admission medications   Medication Sig Start Date End Date Taking? Authorizing Provider  amLODipine  (NORVASC ) 10 MG tablet Take 10 mg by mouth daily after lunch.   Yes [provider]  aspirin  81 MG chewable tablet Chew 1 tablet (81 mg total) by mouth daily. 10/04/23  Yes Hudson, Caralyn, PA-C  atorvastatin  (LIPITOR) 40 MG tablet Take 1 tablet (40 mg total) by mouth daily. Patient taking differently: Take 40 mg by mouth daily after lunch. 10/04/23  Yes Hudson, Caralyn, PA-C  cholecalciferol  (VITAMIN D3) 25 MCG (1000 UNIT) tablet Take 1,000 Units by mouth daily.   Yes [provider]  isosorbide  mononitrate (IMDUR ) 30 MG 24 hr tablet Take 30 mg by mouth daily after lunch.   Yes [provider]  losartan  (COZAAR ) 25 MG tablet Take 1  tablet (25 mg total) by mouth daily. Patient taking differently: Take 25 mg by mouth daily after lunch. 10/04/23  Yes Hudson, Caralyn, PA-C  metoprolol  succinate (TOPROL -XL) 25 MG 24 hr tablet Take 0.5 tablets (12.5 mg total) by mouth daily. Patient taking differently: Take 12.5 mg by mouth daily after lunch. 10/04/23  Yes Hudson, Caralyn, PA-C  Multiple Vitamin (MULTIVITAMIN) tablet Take 1 tablet by mouth daily.   Yes [provider]  omeprazole  (PRILOSEC  OTC) 20 MG tablet Take 20 mg by mouth daily after lunch.   Yes [provider]  tiotropium (SPIRIVA ) 18 MCG inhalation capsule Place 18 mcg into inhaler and inhale daily.   Yes [provider]  clopidogrel (PLAVIX) 75 MG tablet Take 75 mg by mouth daily after lunch.    [provider]   Physical Exam: Vitals:   05/22/24 1430 05/22/24 1445 05/22/24 1500 05/22/24 1521  BP: 104/60 107/69 117/72 106/69  Pulse: 64 66 79 75  Resp: 11 12 12    Temp:   (!) 97 F (36.1 C) (!) 97.3 F (36.3 C)  TempSrc:    Oral  SpO2: 94% 92% 93% 93%  Weight:      Height:       Constitutional: appears age-appropriate, NAD, calm Eyes: PERRL, lids and conjunctivae normal ENMT: Mucous membranes are moist. Posterior pharynx clear of any exudate or  lesions. Age-appropriate dentition. Hearing appropriate Neck: normal, supple, no masses, no thyromegaly Respiratory: clear to auscultation bilaterally, no wheezing, no crackles. Normal respiratory effort. No accessory muscle use.  Cardiovascular: Regular rate and rhythm, no murmurs / rubs / gallops. No extremity edema. 2+ pedal pulses. No carotid bruits.  Abdomen: no tenderness, no masses palpated, no hepatosplenomegaly. Bowel sounds positive.   Musculoskeletal: no clubbing / cyanosis. No joint deformity upper and lower extremities. Good ROM, no contractures, no atrophy. Normal muscle tone. Chest tube in the right lateral wall Skin: no rashes, lesions, ulcers. No induration Neurologic:  Sensation intact. Strength 5/5 in all 4.  Psychiatric: Normal judgment and insight. Alert and oriented x 3. Normal mood.   EKG: Not indicated at this time  Chest x-ray on Admission: I personally reviewed and I agree with radiologist reading as below.  DG Chest Port 1 View Result Date: 05/22/2024 CLINICAL DATA:  Status post bronchoscopy. EXAM: PORTABLE CHEST 1 VIEW COMPARISON:  October 29, 2015. FINDINGS: The heart size and mediastinal contours are within normal limits. Left lung is clear. Right-sided chest tube is noted without definite pneumothorax. Mild right basilar subsegmental atelectasis is noted. The visualized skeletal structures are unremarkable. IMPRESSION: Right-sided chest tube is noted without definite pneumothorax. Mild right basilar subsegmental atelectasis. Electronically Signed   By: Lynwood Landy Raddle M.D.   On: 05/22/2024 13:49   DG C-Arm 1-60 Min-No Report Result Date: 05/22/2024 Fluoroscopy was utilized by the requesting physician.  No radiographic interpretation.   Labs on Admission: I have personally reviewed following labs CBC: Recent Labs  Lab 05/22/24 1627  WBC 10.9*  NEUTROABS 9.6*  HGB 16.5  HCT 46.9  MCV 88.5  PLT 200   Basic Metabolic Panel: Recent Labs  Lab 05/22/24 1627  NA 139  K 3.7  CL 105  CO2 23  GLUCOSE 144*  BUN 19  CREATININE 0.82  CALCIUM  9.2   GFR: Estimated Creatinine Clearance: 91.5 mL/min (by C-G formula based on SCr of 0.82 mg/dL).  Liver Function Tests: Recent Labs  Lab 05/22/24 1627  AST 23  ALT 18  ALKPHOS 76  BILITOT 1.1  PROT 7.2  ALBUMIN 4.6   This document was prepared using Dragon Voice Recognition software and may include unintentional dictation errors.  Dr. Sherre [facility]  If 7PM-7AM, please contact overnight-coverage provider If 7AM-7PM, please contact day attending provider [email or web address]  05/22/2024, 7:04 PM

## 2024-05-22 NOTE — H&P (Signed)
 PULMONOLOGY         Date: 05/22/2024,   MRN# [id number] Dominic Ferguson Jan 21, 1961     AdmissionWeight: 81.2 kg                 CurrentWeight: 81.2 kg  Referring provider: Dr Theotis    CHIEF COMPLAINT:   Hypermetabolic RUL nodule with hilar adenopathy    HISTORY OF PRESENT ILLNESS   This is a 63 yo with hx of alcohol abuse, OA, tobacco smoking, COPD, dyslipidemia and dyspnea who was seen in clinic with [facility] due to dyspnea. He was noted to have abnormal CT chest with Right upper lobe lung nodule.  He was treated with antibiotics and steroids but continued to have progressive dyspnea and persistence of RUL lesion.  He had PET scan with abnormally hypermetabolic RUL lesion and possible hilar/mediastinal adenopathy.  He is here today for bronchoscopy with airway inspection, BAL, therapeutic aspiration of tracheobronchial tree, lung biopsy and endobronchial ultrasound to assess lymph node stations.  He has no new complaints.  His wife is present and all additional questions have been answered. Reviewed risks/complications and benefits with patient, risks include infection, pneumothorax/pneumomediastinum which may require chest tube placement as well as overnight/prolonged hospitalization and possible mechanical ventilation. Other risks include bleeding and very rarely death.  Patient understands risks and wishes to proceed.  Additional questions were answered, and patient is aware that post procedure patient will be going home with family and may experience cough with possible clots on expectoration as well as phlegm which may last few days as well as hoarseness of voice post intubation and mechanical ventilation.    PAST MEDICAL HISTORY   Past Medical History:  Diagnosis Date   Alcohol abuse    Aortic atherosclerosis (HCC)    Arthritis    Asthma    Atherosclerosis of native arteries of extremity with intermittent claudication (HCC)    COPD (chronic obstructive pulmonary  disease) (HCC)    Coronary artery disease    a.) s/p PCI 10/03/2023: 99% oRCA (3.0 x 15 mm Frontier Onyx DES x 2)   DJD (degenerative joint disease), lumbosacral    DOE (dyspnea on exertion)    GERD (gastroesophageal reflux disease)    HLD (hyperlipidemia)    Hypertension    Long term current use of clopidogrel    Long-term use of aspirin  therapy    Lung nodules    Tobacco abuse      SURGICAL HISTORY   Past Surgical History:  Procedure Laterality Date   CORONARY STENT INTERVENTION N/A 10/03/2023   Procedure: CORONARY STENT INTERVENTION;  Surgeon: Florencio Cara BIRCH, MD;  Location: [facility] INVASIVE CV [facility];  Service: Cardiovascular;  Laterality: N/A;   LEFT HEART CATH AND CORONARY ANGIOGRAPHY Left 10/03/2023   Procedure: LEFT HEART CATH AND CORONARY ANGIOGRAPHY;  Surgeon: Florencio Cara BIRCH, MD;  Location: [facility] INVAS[facility] CV [facility];  Service: Cardiovascular;  Laterality: Left;     FAMILY HISTORY   Family History  Problem Relation Age of Onset   Cancer Father    Heart disease Brother      SOCIAL HISTORY   Social History   Tobacco Use   Smoking status: Every Day    Current packs/day: 1.00    Types: Cigarettes   Smokeless tobacco: Never  Vaping Use   Vaping status: Never Used  Substance Use Topics   Alcohol use: Yes    Alcohol/week: 49.0 standard drinks of alcohol    Types: 49 Cans of beer  per week    Comment: weekends   Drug use: Never     MEDICATIONS    Home Medication:    Current Medication:  Current Facility-Administered Medications:    lactated ringers  infusion, , Intravenous, Continuous, Mazzoni, Andrea, MD  Facility-Administered Medications Ordered in Other Encounters:    midazolam  (VERSED ) injection, , Intravenous, Anesthesia Intra-op, Niki Manus SAUNDERS, CRNA, 2 mg at 05/22/24 1139    ALLERGIES   Patient has no known allergies.     REVIEW OF SYSTEMS    Review of Systems:  Gen:  Denies  fever, sweats, chills weigh loss  HEENT: Denies blurred  vision, double vision, ear pain, eye pain, hearing loss, nose bleeds, sore throat Cardiac:  No dizziness, chest pain or heaviness, chest tightness,edema Resp:   reports dyspnea chronically  Gi: Denies swallowing difficulty, stomach pain, nausea or vomiting, diarrhea, constipation, bowel incontinence Gu:  Denies bladder incontinence, burning urine Ext:   Denies Joint pain, stiffness or swelling Skin: Denies  skin rash, easy bruising or bleeding or hives Endoc:  Denies polyuria, polydipsia , polyphagia or weight change Psych:   Denies depression, insomnia or hallucinations   Other:  All other systems negative   VS: BP (!) 149/71   Pulse 70   Temp (!) 97.3 F (36.3 C) (Temporal)   Resp 18   Ht 5' 8 (1.727 m)   Wt 81.2 kg   SpO2 95%   BMI 27.22 kg/m      PHYSICAL EXAM    GENERAL:NAD, no fevers, chills, no weakness no fatigue HEAD: Normocephalic, atraumatic.  EYES: Pupils equal, round, reactive to light. Extraocular muscles intact. No scleral icterus.  MOUTH: Moist mucosal membrane. Dentition intact. No abscess noted.  EAR, NOSE, THROAT: Clear without exudates. No external lesions.  NECK: Supple. No thyromegaly. No nodules. No JVD.  PULMONARY: decreased breath sounds with mild rhonchi worse at bases bilaterally.  CARDIOVASCULAR: S1 and S2. Regular rate and rhythm. No murmurs, rubs, or gallops. No edema. Pedal pulses 2+ bilaterally.  GASTROINTESTINAL: Soft, nontender, nondistended. No masses. Positive bowel sounds. No hepatosplenomegaly.  MUSCULOSKELETAL: No swelling, clubbing, or edema. Range of motion full in all extremities.  NEUROLOGIC: Cranial nerves II through XII are intact. No gross focal neurological deficits. Sensation intact. Reflexes intact.  SKIN: No ulceration, lesions, rashes, or cyanosis. Skin warm and dry. Turgor intact.  PSYCHIATRIC: Mood, affect within normal limits. The patient is awake, alert and oriented x 3. Insight, judgment intact.       IMAGING    Narrative & Impression  CLINICAL DATA:  Initial treatment strategy for lung nodule.   EXAM: NUCLEAR MEDICINE PET SKULL BASE TO THIGH   TECHNIQUE: 8.8 mCi F-18 FDG was injected intravenously. Full-ring PET imaging was performed from the skull base to thigh after the radiotracer. CT data was obtained and used for attenuation correction and anatomic localization.   Fasting blood glucose: 109 mg/dl   COMPARISON:  CT chest 03/02/2024, 12/30/2023, 12/24/2022.   FINDINGS: Mediastinal blood pool activity: SUV max 2.1   Liver activity: SUV max NA   NECK:   No abnormal hypermetabolism.   Incidental CT findings:   None.   CHEST:   10 mm anterior segment right upper lobe nodule (6/50), SUV max 3.0. Right axillary lymph node does not show metabolism above blood pool. No additional abnormal hypermetabolism.   Incidental CT findings:   Atherosclerotic calcification of the aorta and aortic valve with age advanced involvement of all 3 coronary arteries. Heart is at the upper  limits of normal in size to mildly enlarged. No pericardial or pleural effusion. Centrilobular and paraseptal emphysema.   ABDOMEN/PELVIS:   Hypermetabolism within a poorly distended cecum, SUV max 8.1. Otherwise, no additional abnormal hypermetabolism.   Incidental CT findings:   Small low-attenuation lesion in the right kidney, too small to characterize. No specific follow-up necessary.   SKELETON:   No abnormal hypermetabolism.   Incidental CT findings: Degenerative changes in the spine.   IMPRESSION: 1. 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 2. Cecal hypermetabolism. Malignancy cannot be excluded. Recommend correlation with recent or future colonoscopy. 3.  Age advanced three-vessel coronary artery calcification. 4.  Aortic atherosclerosis (ICD10-I70.0).     Electronically Signed   By: Newell Eke M.D.   On:  05/04/2024 10:01    ASSESSMENT/PLAN   Right upper lobe hypermetabolic lesion with possible hilar/mediastinal adenopathy  He is here today for bronchoscopy with airway inspection, BAL, therapeutic aspiration of tracheobronchial tree, lung biopsy and endobronchial ultrasound to assess lymph node stations.  He has no new complaints.  His wife is present and all additional questions have been answered. Reviewed risks/complications and benefits with patient, risks include infection, pneumothorax/pneumomediastinum which may require chest tube placement as well as overnight/prolonged hospitalization and possible mechanical ventilation. Other risks include bleeding and very rarely death.  Patient understands risks and wishes to proceed.  Additional questions were answered, and patient is aware that post procedure patient will be going home with family and may experience cough with possible clots on expectoration as well as phlegm which may last few days as well as hoarseness of voice post intubation and mechanical ventilation.         Thank you for allowing me to participate in the care of this patient.   Patient/Family are satisfied with care plan and all questions have been answered.    Provider disclosure: Patient with at least one acute or chronic illness or injury that poses a threat to life or bodily function and is being managed actively during this encounter.  All of the below services have been performed independently by signing provider:  review of prior documentation from internal and or external health records.  Review of previous and current lab results.  Interview and comprehensive assessment during patient visit today. Review of current and previous chest radiographs/CT scans. Discussion of management and test interpretation with health care team and patient/family.   This document was prepared using Dragon voice recognition software and may include unintentional dictation errors.      Dyonna Jaspers, M.D.  Division of Pulmonary & Critical Care Medicine

## 2024-05-22 NOTE — Plan of Care (Signed)

## 2024-05-22 NOTE — Transfer of Care (Signed)
 Immediate Anesthesia Transfer of Care Note  Patient: Dominic Ferguson  Procedure(s) Performed: VIDEO BRONCHOSCOPY WITH ENDOBRONCHIAL NAVIGATION BRONCHOSCOPY, WITH EBUS  Patient Location: PACU  Anesthesia Type:General  Level of Consciousness: awake and alert   Airway & Oxygen Therapy: Patient Spontanous Breathing and Patient connected to nasal cannula oxygen  Post-op Assessment: Report given to RN and Post -op Vital signs reviewed and stable  Post vital signs: Reviewed and stable  Last Vitals:  Vitals Value Taken Time  BP 125/78 05/22/24 13:27  Temp    Pulse 78 05/22/24 13:27  Resp 19 05/22/24 13:28  SpO2 99 % 05/22/24 13:27  Vitals shown include unfiled device data.  Last Pain:  Vitals:   05/22/24 1011  TempSrc: Temporal  PainSc: 0-No pain         Complications: No notable events documented.

## 2024-05-22 NOTE — Hospital Course (Addendum)
 Mr. Dominic Ferguson is a 63 year old male with history of alcohol abuse, osteoarthritis, tobacco smoking, COPD, dyslipidemia, dyspnea, who is status post outpatient bronchoscopy in setting of right upper lobe lung nodule.  Hospitalist was consulted for pneumothorax status post chest tube placement.  Vitals at the time of consultation showed t 98.9, respiration 14, heart rate 72, blood pressure 112/69, SpO2 of 93% on 2 L nasal cannula.  Serum sodium is 139, potassium 3.7, chloride 105, bicarb 23, BUN of 19, serum creatinine 0.82, EGFR greater than 60, nonfasting blood glucose 144, WBC 10.9, hemoglobin 16.5, platelets of 200.  Chest tube was placed post bronchoscopy.  Hospitalist service was consulted by pulmonologist for pneumothorax.

## 2024-05-22 NOTE — Anesthesia Postprocedure Evaluation (Signed)
 Anesthesia Post Note  Patient: Dominic Ferguson  Procedure(s) Performed: VIDEO BRONCHOSCOPY WITH ENDOBRONCHIAL NAVIGATION BRONCHOSCOPY, WITH EBUS  Patient location during evaluation: PACU Anesthesia Type: General Level of consciousness: awake and awake and alert Pain management: satisfactory to patient Vital Signs Assessment: post-procedure vital signs reviewed and stable Respiratory status: spontaneous breathing and nonlabored ventilation Cardiovascular status: blood pressure returned to baseline Anesthetic complications: no   No notable events documented.   Last Vitals:  Vitals:   05/22/24 1327 05/22/24 1330  BP: 125/78 126/74  Pulse: 83 70  Resp: 13 16  Temp: 37.2 C   SpO2: 99% 99%    Last Pain:  Vitals:   05/22/24 1011  TempSrc: Temporal  PainSc: 0-No pain                 [name],Dominic Hardie

## 2024-05-22 NOTE — Assessment & Plan Note (Signed)
 Atorvastatin  40 mg daily resume

## 2024-05-23 ENCOUNTER — Encounter: Payer: Self-pay | Admitting: Pulmonary Disease

## 2024-05-23 ENCOUNTER — Observation Stay

## 2024-05-23 DIAGNOSIS — F419 Anxiety disorder, unspecified: Secondary | ICD-10-CM | POA: Diagnosis present

## 2024-05-23 DIAGNOSIS — I700 Atherosclerosis of aorta: Secondary | ICD-10-CM | POA: Diagnosis present

## 2024-05-23 DIAGNOSIS — J4489 Other specified chronic obstructive pulmonary disease: Secondary | ICD-10-CM | POA: Diagnosis present

## 2024-05-23 DIAGNOSIS — I10 Essential (primary) hypertension: Secondary | ICD-10-CM | POA: Diagnosis present

## 2024-05-23 DIAGNOSIS — R590 Localized enlarged lymph nodes: Secondary | ICD-10-CM | POA: Diagnosis present

## 2024-05-23 DIAGNOSIS — Z955 Presence of coronary angioplasty implant and graft: Secondary | ICD-10-CM | POA: Diagnosis not present

## 2024-05-23 DIAGNOSIS — Z79899 Other long term (current) drug therapy: Secondary | ICD-10-CM | POA: Diagnosis not present

## 2024-05-23 DIAGNOSIS — Z8249 Family history of ischemic heart disease and other diseases of the circulatory system: Secondary | ICD-10-CM | POA: Diagnosis not present

## 2024-05-23 DIAGNOSIS — J939 Pneumothorax, unspecified: Secondary | ICD-10-CM | POA: Diagnosis not present

## 2024-05-23 DIAGNOSIS — M1990 Unspecified osteoarthritis, unspecified site: Secondary | ICD-10-CM | POA: Diagnosis present

## 2024-05-23 DIAGNOSIS — I2510 Atherosclerotic heart disease of native coronary artery without angina pectoris: Secondary | ICD-10-CM | POA: Diagnosis present

## 2024-05-23 DIAGNOSIS — Z7902 Long term (current) use of antithrombotics/antiplatelets: Secondary | ICD-10-CM | POA: Diagnosis not present

## 2024-05-23 DIAGNOSIS — E785 Hyperlipidemia, unspecified: Secondary | ICD-10-CM | POA: Diagnosis present

## 2024-05-23 DIAGNOSIS — R918 Other nonspecific abnormal finding of lung field: Secondary | ICD-10-CM | POA: Diagnosis present

## 2024-05-23 DIAGNOSIS — J439 Emphysema, unspecified: Secondary | ICD-10-CM | POA: Diagnosis present

## 2024-05-23 DIAGNOSIS — F17210 Nicotine dependence, cigarettes, uncomplicated: Secondary | ICD-10-CM | POA: Diagnosis present

## 2024-05-23 DIAGNOSIS — R911 Solitary pulmonary nodule: Secondary | ICD-10-CM | POA: Diagnosis present

## 2024-05-23 DIAGNOSIS — Z7982 Long term (current) use of aspirin: Secondary | ICD-10-CM | POA: Diagnosis not present

## 2024-05-23 DIAGNOSIS — Y838 Other surgical procedures as the cause of abnormal reaction of the patient, or of later complication, without mention of misadventure at the time of the procedure: Secondary | ICD-10-CM | POA: Diagnosis present

## 2024-05-23 DIAGNOSIS — K219 Gastro-esophageal reflux disease without esophagitis: Secondary | ICD-10-CM | POA: Diagnosis present

## 2024-05-23 DIAGNOSIS — J95811 Postprocedural pneumothorax: Secondary | ICD-10-CM | POA: Diagnosis present

## 2024-05-23 LAB — CBC
HCT: 47.5 % (ref 39.0–52.0)
Hemoglobin: 16.4 g/dL (ref 13.0–17.0)
MCH: 30.5 pg (ref 26.0–34.0)
MCHC: 34.5 g/dL (ref 30.0–36.0)
MCV: 88.3 fL (ref 80.0–100.0)
Platelets: 213 K/uL (ref 150–400)
RBC: 5.38 MIL/uL (ref 4.22–5.81)
RDW: 11.6 % (ref 11.5–15.5)
WBC: 10.5 K/uL (ref 4.0–10.5)
nRBC: 0 % (ref 0.0–0.2)

## 2024-05-23 LAB — BASIC METABOLIC PANEL WITH GFR
Anion gap: 11 (ref 5–15)
BUN: 19 mg/dL (ref 8–23)
CO2: 26 mmol/L (ref 22–32)
Calcium: 9.5 mg/dL (ref 8.9–10.3)
Chloride: 100 mmol/L (ref 98–111)
Creatinine, Ser: 0.84 mg/dL (ref 0.61–1.24)
GFR, Estimated: >60 mL/min (ref >60)
Glucose, Bld: 125 mg/dL — ABNORMAL HIGH (ref 70–99)
Potassium: 3.9 mmol/L (ref 3.5–5.1)
Sodium: 137 mmol/L (ref 135–145)

## 2024-05-23 MED ORDER — MORPHINE SULFATE (PF) 2 MG/ML IV SOLN: 2.0000 mg | INTRAVENOUS | Status: AC | PRN

## 2024-05-23 MED ORDER — FAMOTIDINE 20 MG PO TABS
20.0000 mg | ORAL_TABLET | Freq: Every day | ORAL | Status: DC
  Administered 2024-05-23 – 2024-05-24 (×2): 20 mg via ORAL
  Filled 2024-05-23 (×2): qty 1

## 2024-05-23 NOTE — Plan of Care (Signed)

## 2024-05-23 NOTE — Progress Notes (Signed)
 PROGRESS NOTE    Dominic Ferguson  FMW:[id number] DOB: 1960/12/19 DOA: 05/22/2024 PCP: Epifanio Alm SQUIBB, MD    Brief Narrative:  63 year old male with history of alcohol abuse, osteoarthritis, tobacco smoking, COPD, dyslipidemia, dyspnea, who is status post outpatient bronchoscopy in setting of right upper lobe lung nodule.   Chest tube was placed post bronchoscopy. Hospitalist service was consulted by pulmonologist for pneumothorax.   Chest tube initially improved after pulmonology evaluation we clamped chest tube for 2 hours repeat chest x-ray.  Pneumothorax is slightly worse.   Assessment & Plan:   Principal Problem:   Pneumothorax on right Active Problems:   Asthma   DJD (degenerative joint disease), lumbosacral   DOE (dyspnea on exertion)   Essential hypertension   Hyperlipidemia   Pneumothorax  * Pneumothorax on right Post bronchoscopy Status post chest tube placement by pulmonology Attempted to discontinue chest tube on 8/9 however pneumothorax slightly worsened after clamping chest tube for 2 hours Plan: Reopen chest tube Pulmonology will follow-up Attempt discontinuation tomorrow   Hyperlipidemia Continue atorvastatin  40 mg daily   Essential hypertension Continue Home amlodipine  10 mg daily, Imdur  30 mg daily, losartan  25 mg daily, metoprolol  succinate 12.5 mg daily    DOE (dyspnea on exertion) Continue albuterol  nebulizer as needed for wheezing and shortness of breath   Asthma Continue albuterol  as needed   DVT prophylaxis: SQ heparin  Code Status: Full Family Communication: None Disposition Plan: Status is: Inpatient Remains inpatient appropriate because: Symptomatic pneumothorax   Level of care: Telemetry Medical  Consultants:  Pulmonology  Procedures:  Chest tube  Antimicrobials: None   Subjective: Seen and examined.  Resting in bed.  Reports poor sleep  Objective: Vitals:   05/22/24 2020 05/23/24 0341 05/23/24 0400 05/23/24 0726  BP:  103/62 105/66  (!) 101/58  Pulse: 88 82  82  Resp: 16 16  18   Temp:  97.7 F (36.5 C)  98 F (36.7 C)  TempSrc:  Oral    SpO2: 91% (!) 88% 92% 90%  Weight:      Height:        Intake/Output Summary (Last 24 hours) at 05/23/2024 1134 Last data filed at 05/23/2024 0955 Gross per 24 hour  Intake 2300 ml  Output 0 ml  Net 2300 ml   Filed Weights   05/22/24 1011  Weight: 81.2 kg    Examination:  General exam: Appears calm and comfortable  Respiratory system: Clear.  No work of breathing.  Right-sided chest tube Cardiovascular system: 1 S2, RRR, no murmurs, no pedal edema Gastrointestinal system: Soft, NT/ND, normal bowel sounds Central nervous system: Alert and oriented. No focal neurological deficits. Extremities: Symmetric 5 x 5 power. Skin: No rashes, lesions or ulcers Psychiatry: Judgement and insight appear normal. Mood & affect appropriate.     Data Reviewed: I have personally reviewed following labs and imaging studies  CBC: Recent Labs  Lab 05/22/24 1627 05/23/24 0454  WBC 10.9* 10.5  NEUTROABS 9.6*  --   HGB 16.5 16.4  HCT 46.9 47.5  MCV 88.5 88.3  PLT 200 213   Basic Metabolic Panel: Recent Labs  Lab 05/22/24 1627 05/23/24 0454  NA 139 137  K 3.7 3.9  CL 105 100  CO2 23 26  GLUCOSE 144* 125*  BUN 19 19  CREATININE 0.82 0.84  CALCIUM  9.2 9.5   GFR: Estimated Creatinine Clearance: 89.3 mL/min (by C-G formula based on SCr of 0.84 mg/dL). Liver Function Tests: Recent Labs  Lab 05/22/24 1627  AST 23  ALT 18  ALKPHOS 76  BILITOT 1.1  PROT 7.2  ALBUMIN 4.6   No results for input(s): LIPASE, AMYLASE in the last 168 hours. No results for input(s): AMMONIA in the last 168 hours. Coagulation Profile: No results for input(s): INR, PROTIME in the last 168 hours. Cardiac Enzymes: No results for input(s): CKTOTAL, CKMB, CKMBINDEX, TROPONINI in the last 168 hours. BNP (last 3 results) No results for input(s): PROBNP in the last  8760 hours. HbA1C: No results for input(s): HGBA1C in the last 72 hours. CBG: No results for input(s): GLUCAP in the last 168 hours. Lipid Profile: No results for input(s): CHOL, HDL, LDLCALC, TRIG, CHOLHDL, LDLDIRECT in the last 72 hours. Thyroid Function Tests: No results for input(s): TSH, T4TOTAL, FREET4, T3FREE, THYROIDAB in the last 72 hours. Anemia Panel: No results for input(s): VITAMINB12, FOLATE, FERRITIN, TIBC, IRON, RETICCTPCT in the last 72 hours. Sepsis Labs: No results for input(s): PROCALCITON, LATICACIDVEN in the last 168 hours.  Recent Results (from the past 240 hours)  Culture, BAL-quantitative w Gram Stain     Status: None (Preliminary result)   Collection Time: 05/22/24 11:54 AM   Specimen: Bronchoalveolar Lavage; Respiratory  Result Value Ref Range Status   Specimen Description   Final    BRONCHIAL ALVEOLAR LAVAGE Performed at [facility], [address] [place], [place] [address]    Special Requests   Final    NONE Performed at [facility], [address] [place], [place] [address]    Gram Stain   Final    NO WBC SEEN NO ORGANISMS SEEN Performed at [facility], [address] [place], [place] [address]    Culture PENDING  Incomplete   Report Status PENDING  Incomplete         Radiology Studies: DG Chest Port 1 View Result Date: 05/23/2024 CLINICAL DATA:  Pneumothorax. EXAM: PORTABLE CHEST 1 VIEW COMPARISON:  [id number] FINDINGS: Right pleural drain again noted. Pleural line now identified in the right apex consistent with interval development of a tiny apical right pneumothorax. Dependent atelectasis noted bilaterally. No substantial pleural effusion. The cardiopericardial silhouette is within normal limits for size. Telemetry leads overlie the chest. IMPRESSION: Interval development of a tiny right apical pneumothorax. Right pleural drain remains in place. Electronically Signed   By:  Camellia Candle M.D.   On: 05/23/2024 08:02   DG Chest Port 1 View Result Date: 05/22/2024 CLINICAL DATA:  Status post bronchoscopy. EXAM: PORTABLE CHEST 1 VIEW COMPARISON:  October 29, 2015. FINDINGS: The heart size and mediastinal contours are within normal limits. Left lung is clear. Right-sided chest tube is noted without definite pneumothorax. Mild right basilar subsegmental atelectasis is noted. The visualized skeletal structures are unremarkable. IMPRESSION: Right-sided chest tube is noted without definite pneumothorax. Mild right basilar subsegmental atelectasis. Electronically Signed   By: Lynwood Landy Raddle M.D.   On: 05/22/2024 13:49   DG C-Arm 1-60 Min-No Report Result Date: 05/22/2024 Fluoroscopy was utilized by the requesting physician.  No radiographic interpretation.        Scheduled Meds:  amLODipine   10 mg Oral QPC lunch   aspirin   81 mg Oral Daily   atorvastatin   40 mg Oral QPC lunch   heparin   5,000 Units Subcutaneous Q8H   isosorbide  mononitrate  30 mg Oral QPC lunch   losartan   25 mg Oral QPC lunch   metoprolol  succinate  12.5 mg Oral QPC lunch   pantoprazole   40 mg Oral Daily   umeclidinium  bromide  1 puff Inhalation Daily   Continuous Infusions:   LOS: 0 days      Calvin KATHEE Robson, MD [facility]   If 7PM-7AM, please contact night-coverage  05/23/2024, 11:34 AM

## 2024-05-23 NOTE — Progress Notes (Signed)
 PULMONOLOGY         Date: 05/23/2024,   MRN# [id number] Dominic Ferguson 10-01-1961     AdmissionWeight: 81.2 kg                 CurrentWeight: 81.2 kg  Referring provider: Dr Theotis    CHIEF COMPLAINT:   Hypermetabolic RUL nodule with hilar adenopathy    HISTORY OF PRESENT ILLNESS   This is a 63 yo with hx of alcohol abuse, OA, tobacco smoking, COPD, dyslipidemia and dyspnea who was seen in clinic with [facility] pulmonary due to dyspnea. He was noted to have abnormal CT chest with Right upper lobe lung nodule.  He was treated with antibiotics and steroids but continued to have progressive dyspnea and persistence of RUL lesion.  He had PET scan with abnormally hypermetabolic RUL lesion and possible hilar/mediastinal adenopathy.  He is here today for bronchoscopy with airway inspection, BAL, therapeutic aspiration of tracheobronchial tree, lung biopsy and endobronchial ultrasound to assess lymph node stations.  He has no new complaints.  His wife is present and all additional questions have been answered. Reviewed risks/complications and benefits with patient, risks include infection, pneumothorax/pneumomediastinum which may require chest tube placement as well as overnight/prolonged hospitalization and possible mechanical ventilation. Other risks include bleeding and very rarely death.  Patient understands risks and wishes to proceed.  Additional questions were answered, and patient is aware that post procedure patient will be going home with family and may experience cough with possible clots on expectoration as well as phlegm which may last few days as well as hoarseness of voice post intubation and mechanical ventilation.  05/23/24- patient is stable, he's on 2L/min [place] saturating >90%.  We clamped chest tube this am and there is slight recurrence of apical right pneumothorax so we will keep patient ovenright on negative 10 suction. Will reclamp and re image in am for chest tube removal.    PAST MEDICAL HISTORY   Past Medical History:  Diagnosis Date   Alcohol abuse    Aortic atherosclerosis (HCC)    Arthritis    Asthma    Atherosclerosis of native arteries of extremity with intermittent claudication (HCC)    COPD (chronic obstructive pulmonary disease) (HCC)    Coronary artery disease    a.) s/p PCI 10/03/2023: 99% oRCA (3.0 x 15 mm Frontier Onyx DES x 2)   DJD (degenerative joint disease), lumbosacral    DOE (dyspnea on exertion)    GERD (gastroesophageal reflux disease)    HLD (hyperlipidemia)    Hypertension    Long term current use of clopidogrel    Long-term use of aspirin  therapy    Lung nodules    Tobacco abuse      SURGICAL HISTORY   Past Surgical History:  Procedure Laterality Date   CORONARY STENT INTERVENTION N/A 10/03/2023   Procedure: CORONARY STENT INTERVENTION;  Surgeon: Florencio Cara BIRCH, MD;  Location: [facility];  Service: Cardiovascular;  Laterality: N/A;   LEFT HEART CATH AND CORONARY ANGIOGRAPHY Left 10/03/2023   Procedure: LEFT HEART CATH AND CORONARY ANGIOGRAPHY;  Surgeon: Florencio Cara BIRCH, MD;  Location: [facility];  Service: Cardiovascular;  Laterality: Left;   VIDEO BRONCHOSCOPY WITH ENDOBRONCHIAL NAVIGATION N/A 05/22/2024   Procedure: VIDEO BRONCHOSCOPY WITH ENDOBRONCHIAL NAVIGATION;  Surgeon: Parris Manna, MD;  Location: [facility] OR[facility];  Service: Thoracic;  Laterality: N/A;   VIDEO BRONCHOSCOPY WITH ENDOBRONCHIAL ULTRASOUND N/A 05/22/2024   Procedure: BRONCHOSCOPY, WITH EBUS;  Surgeon: Parris Manna,  MD;  Location: [facility];  Service: Thoracic;  Laterality: N/A;     FAMILY HISTORY   Family History  Problem Relation Age of Onset   Cancer Father    Heart disease Brother      SOCIAL HISTORY   Social History   Tobacco Use   Smoking status: Every Day    Current packs/day: 1.00    Types: Cigarettes   Smokeless tobacco: Never  Vaping Use   Vaping status: Never Used  Substance Use Topics   Alcohol  use: Yes    Alcohol/week: 49.0 standard drinks of alcohol    Types: 49 Cans of beer per week    Comment: weekends   Drug use: Never     MEDICATIONS    Home Medication:    Current Medication:  Current Facility-Administered Medications:    acetaminophen  (TYLENOL ) tablet 650 mg, 650 mg, Oral, Q6H PRN **OR** acetaminophen  (TYLENOL ) suppository 650 mg, 650 mg, Rectal, Q6H PRN, Cox, Amy N, DO   albuterol  (PROVENTIL ) (2.5 MG/3ML) 0.083% nebulizer solution 2.5 mg, 2.5 mg, Nebulization, Q6H PRN, Cox, Amy N, DO   amLODipine  (NORVASC ) tablet 10 mg, 10 mg, Oral, QPC lunch, Cox, Amy N, DO   aspirin  chewable tablet 81 mg, 81 mg, Oral, Daily, Cox, Amy N, DO, 81 mg at 05/23/24 9188   atorvastatin  (LIPITOR) tablet 40 mg, 40 mg, Oral, QPC lunch, Cox, Amy N, DO   heparin  injection 5,000 Units, 5,000 Units, Subcutaneous, Q8H, Cox, Amy N, DO, 5,000 Units at 05/23/24 0507   hydrALAZINE  (APRESOLINE ) injection 5 mg, 5 mg, Intravenous, Q6H PRN, Cox, Amy N, DO   isosorbide  mononitrate (IMDUR ) 24 hr tablet 30 mg, 30 mg, Oral, QPC lunch, Cox, Amy N, DO   losartan  (COZAAR ) tablet 25 mg, 25 mg, Oral, QPC lunch, Cox, Amy N, DO   metoprolol  succinate (TOPROL -XL) 24 hr tablet 12.5 mg, 12.5 mg, Oral, QPC lunch, Cox, Amy N, DO   morphine  (PF) 2 MG/ML injection 2 mg, 2 mg, Intravenous, Q4H PRN, Sreenath, Sudheer B, MD   nicotine  (NICODERM CQ  - dosed in mg/24 hours) patch 21 mg, 21 mg, Transdermal, Daily PRN, Cox, Amy N, DO, 21 mg at 05/22/24 1654   ondansetron  (ZOFRAN ) tablet 4 mg, 4 mg, Oral, Q6H PRN **OR** ondansetron  (ZOFRAN ) injection 4 mg, 4 mg, Intravenous, Q6H PRN, Cox, Amy N, DO   oxyCODONE  (Oxy IR/ROXICODONE ) immediate release tablet 5 mg, 5 mg, Oral, Q6H PRN, Cox, Amy N, DO   pantoprazole  (PROTONIX ) EC tablet 40 mg, 40 mg, Oral, Daily, Cox, Amy N, DO   senna-docusate (Senokot-S) tablet 1 tablet, 1 tablet, Oral, QHS PRN, Cox, Amy N, DO   umeclidinium bromide  (INCRUSE ELLIPTA ) 62.5 MCG/ACT 1 puff, 1 puff,  Inhalation, Daily, Cox, Amy N, DO, 1 puff at 05/23/24 0903    ALLERGIES   Patient has no known allergies.     REVIEW OF SYSTEMS    Review of Systems:  Gen:  Denies  fever, sweats, chills weigh loss  HEENT: Denies blurred vision, double vision, ear pain, eye pain, hearing loss, nose bleeds, sore throat Cardiac:  No dizziness, chest pain or heaviness, chest tightness,edema Resp:   reports dyspnea chronically  Gi: Denies swallowing difficulty, stomach pain, nausea or vomiting, diarrhea, constipation, bowel incontinence Gu:  Denies bladder incontinence, burning urine Ext:   Denies Joint pain, stiffness or swelling Skin: Denies  skin rash, easy bruising or bleeding or hives Endoc:  Denies polyuria, polydipsia , polyphagia or weight change Psych:   Denies depression,  insomnia or hallucinations   Other:  All other systems negative   VS: BP (!) 101/58 (BP Location: Left Arm)   Pulse 82   Temp 98 F (36.7 C)   Resp 18   Ht 5' 8 (1.727 m)   Wt 81.2 kg   SpO2 90%   BMI 27.22 kg/m      PHYSICAL EXAM    GENERAL:NAD, no fevers, chills, no weakness no fatigue HEAD: Normocephalic, atraumatic.  EYES: Pupils equal, round, reactive to light. Extraocular muscles intact. No scleral icterus.  MOUTH: Moist mucosal membrane. Dentition intact. No abscess noted.  EAR, NOSE, THROAT: Clear without exudates. No external lesions.  NECK: Supple. No thyromegaly. No nodules. No JVD.  PULMONARY: decreased breath sounds with mild rhonchi worse at bases bilaterally.  CARDIOVASCULAR: S1 and S2. Regular rate and rhythm. No murmurs, rubs, or gallops. No edema. Pedal pulses 2+ bilaterally.  GASTROINTESTINAL: Soft, nontender, nondistended. No masses. Positive bowel sounds. No hepatosplenomegaly.  MUSCULOSKELETAL: No swelling, clubbing, or edema. Range of motion full in all extremities.  NEUROLOGIC: Cranial nerves II through XII are intact. No gross focal neurological deficits. Sensation intact.  Reflexes intact.  SKIN: No ulceration, lesions, rashes, or cyanosis. Skin warm and dry. Turgor intact.  PSYCHIATRIC: Mood, affect within normal limits. The patient is awake, alert and oriented x 3. Insight, judgment intact.       IMAGING   Narrative & Impression  CLINICAL DATA:  Initial treatment strategy for lung nodule.   EXAM: NUCLEAR MEDICINE PET SKULL BASE TO THIGH   TECHNIQUE: 8.8 mCi F-18 FDG was injected intravenously. Full-ring PET imaging was performed from the skull base to thigh after the radiotracer. CT data was obtained and used for attenuation correction and anatomic localization.   Fasting blood glucose: 109 mg/dl   COMPARISON:  CT chest 03/02/2024, 12/30/2023, 12/24/2022.   FINDINGS: Mediastinal blood pool activity: SUV max 2.1   Liver activity: SUV max NA   NECK:   No abnormal hypermetabolism.   Incidental CT findings:   None.   CHEST:   10 mm anterior segment right upper lobe nodule (6/50), SUV max 3.0. Right axillary lymph node does not show metabolism above blood pool. No additional abnormal hypermetabolism.   Incidental CT findings:   Atherosclerotic calcification of the aorta and aortic valve with age advanced involvement of all 3 coronary arteries. Heart is at the upper limits of normal in size to mildly enlarged. No pericardial or pleural effusion. Centrilobular and paraseptal emphysema.   ABDOMEN/PELVIS:   Hypermetabolism within a poorly distended cecum, SUV max 8.1. Otherwise, no additional abnormal hypermetabolism.   Incidental CT findings:   Small low-attenuation lesion in the right kidney, too small to characterize. No specific follow-up necessary.   SKELETON:   No abnormal hypermetabolism.   Incidental CT findings: Degenerative changes in the spine.   IMPRESSION: 1. 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 2. Cecal hypermetabolism. Malignancy  cannot be excluded. Recommend correlation with recent or future colonoscopy. 3.  Age advanced three-vessel coronary artery calcification. 4.  Aortic atherosclerosis (ICD10-I70.0).     Electronically Signed   By: Newell Eke M.D.   On: 05/04/2024 10:01    ASSESSMENT/PLAN   Right upper lobe hypermetabolic lesion with possible hilar/mediastinal adenopathy  -s/p bronchoscopy with small apical pneumothorax    - chest tube pigtail 74F in place -    - cxr with small persistent pneumothorax on right will keep on suction.  Thank you for allowing me to participate in the care of this patient.   Patient/Family are satisfied with care plan and all questions have been answered.    Provider disclosure: Patient with at least one acute or chronic illness or injury that poses a threat to life or bodily function and is being managed actively during this encounter.  All of the below services have been performed independently by signing provider:  review of prior documentation from internal and or external health records.  Review of previous and current lab results.  Interview and comprehensive assessment during patient visit today. Review of current and previous chest radiographs/CT scans. Discussion of management and test interpretation with health care team and patient/family.   This document was prepared using Dragon voice recognition software and may include unintentional dictation errors.     Clairessa Boulet, M.D.  Division of Pulmonary & Critical Care Medicine

## 2024-05-24 ENCOUNTER — Inpatient Hospital Stay

## 2024-05-24 DIAGNOSIS — J939 Pneumothorax, unspecified: Secondary | ICD-10-CM | POA: Diagnosis not present

## 2024-05-24 DIAGNOSIS — I2510 Atherosclerotic heart disease of native coronary artery without angina pectoris: Secondary | ICD-10-CM | POA: Insufficient documentation

## 2024-05-24 MED ORDER — MORPHINE SULFATE (PF) 2 MG/ML IV SOLN: 2.0000 mg | INTRAVENOUS | Status: DC | PRN

## 2024-05-24 NOTE — Progress Notes (Unsigned)
 MRN : [id number]  Dominic Ferguson is a 63 y.o. (29-Jan-1961) male who presents with chief complaint of check circulation.  History of Present Illness:   The patient is seen for follow-up regarding atherosclerotic occlusive disease bilateral lower extremities associated with leg pain.  He notes that the vast majority of the pain he is experiencing is isolated to the left leg.  Patient notes the pain is variable and not always associated with activity.  In fact he states it is better with walking or being active.  The pain is somewhat consistent day to day occurring on most days. The patient notes the pain also occurs with standing and routinely seems worse as the day wears on. The pain has been progressive over the past several years. The patient states these symptoms are causing  a negative impact on quality of life and daily activities which was a factor in the referral.   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  He is currently following with the pain management service.   The patient denies rest pain or dangling of an extremity off the side of the bed during the night for relief. No open wounds or sores at this time. No history of DVT or phlebitis. No prior vascular interventions or surgeries.    ABI's Rt=0.87 and Lt=1.09.  (previous ABI's Rt=0.84 and Lt=1.02)  No outpatient medications have been marked as taking for the 05/25/24 encounter (Appointment) with Jama, Cordella MATSU, MD.    Past Medical History:  Diagnosis Date   Alcohol abuse    Aortic atherosclerosis (HCC)    Arthritis    Asthma    Atherosclerosis of native arteries of extremity with intermittent claudication (HCC)    COPD (chronic obstructive pulmonary disease) (HCC)    Coronary artery disease    a.) s/p PCI 10/03/2023: 99% oRCA (3.0 x 15 mm Frontier Onyx DES x 2)   DJD (degenerative joint disease), lumbosacral     DOE (dyspnea on exertion)    GERD (gastroesophageal reflux disease)    HLD (hyperlipidemia)    Hypertension    Long term current use of clopidogrel    Long-term use of aspirin  therapy    Lung nodules    Tobacco abuse     Past Surgical History:  Procedure Laterality Date   CORONARY STENT INTERVENTION N/A 10/03/2023   Procedure: CORONARY STENT INTERVENTION;  Surgeon: Florencio Cara BIRCH, MD;  Location: [facility];  Service: Cardiovascular;  Laterality: N/A;   LEFT HEART CATH AND CORONARY ANGIOGRAPHY Left 10/03/2023   Procedure: LEFT HEART CATH AND CORONARY ANGIOGRAPHY;  Surgeon: Florencio Cara BIRCH, MD;  Location: [facility];  Service: Cardiovascular;  Laterality: Left;   VIDEO BRONCHOSCOPY WITH ENDOBRONCHIAL NAVIGATION N/A 05/22/2024   Procedure: VIDEO BRONCHOSCOPY WITH ENDOBRONCHIAL NAVIGATION;  Surgeon: Parris Manna, MD;  Location: [facility];  Service: Thoracic;  Laterality: N/A;   VIDEO BRONCHOSCOPY WITH ENDOBRONCHIAL ULTRASOUND N/A 05/22/2024   Procedure: BRONCHOSCOPY, WITH EBUS;  Surgeon: Parris Manna, MD;  Location: [facility];  Service: Thoracic;  Laterality: N/A;  Social History Social History   Tobacco Use   Smoking status: Every Day    Current packs/day: 1.00    Types: Cigarettes   Smokeless tobacco: Never  Vaping Use   Vaping status: Never Used  Substance Use Topics   Alcohol use: Yes    Alcohol/week: 49.0 standard drinks of alcohol    Types: 49 Cans of beer per week    Comment: weekends   Drug use: Never    Family History Family History  Problem Relation Age of Onset   Cancer Father    Heart disease Brother     No Known Allergies   REVIEW OF SYSTEMS (Negative unless checked)  Constitutional: [] Weight loss  [] Fever  [] Chills Cardiac: [] Chest pain   [] Chest pressure   [] Palpitations   [] Shortness of breath when laying flat   [] Shortness of breath with exertion. Vascular:  [x] Pain in legs with walking   [] Pain in legs at rest   [] History of DVT   [] Phlebitis   [] Swelling in legs   [] Varicose veins   [] Non-healing ulcers Pulmonary:   [] Uses home oxygen   [] Productive cough   [] Hemoptysis   [] Wheeze  [] COPD   [x] Asthma Neurologic:  [] Dizziness   [] Seizures   [] History of stroke   [] History of TIA  [] Aphasia   [] Vissual changes   [] Weakness or numbness in arm   [x] Weakness or numbness in leg Musculoskeletal:   [] Joint swelling   [x] Joint pain   [x] Low back pain Hematologic:  [] Easy bruising  [] Easy bleeding   [] Hypercoagulable state   [] Anemic Gastrointestinal:  [] Diarrhea   [] Vomiting  [] Gastroesophageal reflux/heartburn   [] Difficulty swallowing. Genitourinary:  [] Chronic kidney disease   [] Difficult urination  [] Frequent urination   [] Blood in urine Skin:  [] Rashes   [] Ulcers  Psychological:  [] History of anxiety   []  History of major depression.  Physical Examination  There were no vitals filed for this visit. There is no height or weight on file to calculate BMI. Gen: WD/WN, NAD Head: [place]/AT, No temporalis wasting.  Ear/Nose/Throat: Hearing grossly intact, nares w/o erythema or drainage Eyes: PER, EOMI, sclera nonicteric.  Neck: Supple, no masses.  No bruit or JVD.  Pulmonary:  Good air movement, no audible wheezing, no use of accessory muscles.  Cardiac: RRR, normal S1, S2, no Murmurs. Vascular:  mild trophic changes, no open wounds Vessel Right Left  Radial Palpable Palpable  PT Not Palpable  Palpable  DP Not Palpable Palpable  Gastrointestinal: soft, non-distended. No guarding/no peritoneal signs.  Musculoskeletal: M/S 5/5 throughout.  No visible deformity.  Neurologic: CN 2-12 intact. Pain and light touch intact in extremities.  Symmetrical.  Speech is fluent. Motor exam as listed above. Psychiatric: Judgment intact, Mood & affect appropriate for pt's clinical situation. Dermatologic: No rashes or ulcers noted.  No changes consistent with cellulitis.   CBC Lab Results  Component Value Date   WBC  10.5 05/23/2024   HGB 16.4 05/23/2024   HCT 47.5 05/23/2024   MCV 88.3 05/23/2024   PLT 213 05/23/2024    BMET    Component Value Date/Time   NA 137 05/23/2024 0454   K 3.9 05/23/2024 0454   CL 100 05/23/2024 0454   CO2 26 05/23/2024 0454   GLUCOSE 125 (H) 05/23/2024 0454   BUN 19 05/23/2024 0454   CREATININE 0.84 05/23/2024 0454   CALCIUM  9.5 05/23/2024 0454   GFRNONAA >60 05/23/2024 0454   Estimated Creatinine Clearance: 89.3 mL/min (by C-G formula based on SCr of 0.84 mg/dL).  COAG No results found for: INR, PROTIME  Radiology DG Chest Port 1 View Result Date: 05/24/2024 CLINICAL DATA:  [id number] Pneumothorax [id number] EXAM: PORTABLE CHEST - 1 VIEW COMPARISON:  May 24, 2024 FINDINGS: Similarly positioned right-sided thoracostomy tube terminating in the right mid lung region. Streaky bibasilar atelectasis. No visualized pneumothorax or pleural effusion. No cardiomegaly. Extensive subcutaneous gas along the right supraclavicular fossa and chest wall, unchanged. IMPRESSION: Similarly positioned right-sided thoracostomy tube. No visualized pneumothorax. Electronically Signed   By: Rogelia Myers M.D.   On: 05/24/2024 11:33   DG Chest Port 1 View Result Date: 05/24/2024 CLINICAL DATA:  Chest tube removal. EXAM: PORTABLE CHEST 1 VIEW COMPARISON:  05/24/2024 and CT chest 05/18/2024. FINDINGS: Trachea is midline. Heart size normal. Trace right apical pneumothorax status post right chest tube removal. Medial anterior segment right upper lobe nodule is poorly visualized. Minimal streaky atelectasis in the left lower lobe. Extensive subcutaneous emphysema along the right chest wall. No pleural fluid. IMPRESSION: 1. Trace right apical pneumothorax status post right chest tube removal. 2. Known anterior segment right upper lobe nodule is better visualized on CT chest 05/18/2024 and was hypermetabolic on PET 05/04/2024. 3. Minimal left basilar subsegmental atelectasis. 4. Extensive subcutaneous  emphysema along the right chest wall. Electronically Signed   By: Newell Eke M.D.   On: 05/24/2024 11:25   CT SUPER D CHEST WO MONARCH PILOT Result Date: 05/24/2024 CLINICAL DATA:  Pulmonary nodules. EXAM: CT CHEST WITHOUT CONTRAST TECHNIQUE: Multidetector CT imaging of the chest was performed using thin slice collimation for electromagnetic bronchoscopy planning purposes, without intravenous contrast. RADIATION DOSE REDUCTION: This exam was performed according to the departmental dose-optimization program which includes automated exposure control, adjustment of the mA and/or kV according to patient size and/or use of iterative reconstruction technique. COMPARISON:  PET-CT 05/04/2024 FINDINGS: Cardiovascular: The heart size is normal. No substantial pericardial effusion. Coronary artery calcification is evident. Mild atherosclerotic calcification is noted in the wall of the thoracic aorta. Mediastinum/Nodes: No mediastinal lymphadenopathy. 8 mm short axis low right paratracheal node is stable since CT 03/02/2024. No evidence for gross hilar lymphadenopathy although assessment is limited by the lack of intravenous contrast on the current study. The esophagus has normal imaging features. There is no axillary lymphadenopathy. Lungs/Pleura: Centrilobular and paraseptal emphysema evident. 10 mm anterior right lung nodule identified, demonstrated to be hypermetabolic on recent PET imaging. Additional scattered tiny pulmonary nodules are stable in the interval. No new suspicious pulmonary nodule or mass. No focal airspace consolidation. There is no evidence of pleural effusion. Upper Abdomen: Visualized portion of the upper abdomen shows no acute findings. Musculoskeletal: No worrisome lytic or sclerotic osseous abnormality. IMPRESSION: 1. 10 mm anterior right lung nodule, demonstrated to be hypermetabolic on recent PET imaging. 2. Additional scattered tiny pulmonary nodules are stable in the interval. 3. Aortic  Atherosclerosis (ICD10-I70.0) and Emphysema (ICD10-J43.9). Electronically Signed   By: Camellia Candle M.D.   On: 05/24/2024 08:11   DG Chest Port 1 View Result Date: 05/24/2024 CLINICAL DATA:  Pneumothorax. EXAM: PORTABLE CHEST 1 VIEW COMPARISON:  05/23/2024 FINDINGS: Interval decrease in size of the right-sided pneumothorax with no discernible pleural line on the current study. Soft tissue gas in the right chest wall is progressive in the interval. No focal consolidation, pulmonary edema, or substantial pleural effusion. There is some minimal atelectasis in the lung bases as before. The cardiopericardial silhouette is within normal limits for size. Telemetry leads overlie the chest. IMPRESSION: 1. Interval decrease in size of the  right-sided pneumothorax with no discernible pleural line on the current study. 2. Progressive soft tissue gas in the right chest wall. Electronically Signed   By: Camellia Candle M.D.   On: 05/24/2024 08:07   DG Chest Port 1 View Result Date: 05/23/2024 CLINICAL DATA:  Pneumothorax. EXAM: PORTABLE CHEST 1 VIEW COMPARISON:  Earlier today. FINDINGS: Right pneumothorax is increased in size from earlier today, now moderate. Pigtail catheter is again seen coiled projecting over the lateral upper hemithorax. Increased subcutaneous emphysema in the right chest wall. There is no definite mediastinal shift. Increasing right infrahilar opacity. Mild left lung base atelectasis. IMPRESSION: 1. Right pneumothorax has increased in size from earlier today, now moderate. Increased subcutaneous emphysema in the right chest wall. Right-sided pigtail catheter remains in place. 2. Increasing right infrahilar opacity, likely atelectasis. Electronically Signed   By: Andrea Gasman M.D.   On: 05/23/2024 14:04   DG Chest Port 1 View Result Date: 05/23/2024 CLINICAL DATA:  Pneumothorax. EXAM: PORTABLE CHEST 1 VIEW COMPARISON:  [id number] FINDINGS: Right pleural drain again noted. Pleural line now identified in  the right apex consistent with interval development of a tiny apical right pneumothorax. Dependent atelectasis noted bilaterally. No substantial pleural effusion. The cardiopericardial silhouette is within normal limits for size. Telemetry leads overlie the chest. IMPRESSION: Interval development of a tiny right apical pneumothorax. Right pleural drain remains in place. Electronically Signed   By: Camellia Candle M.D.   On: 05/23/2024 08:02   DG Chest Port 1 View Result Date: 05/22/2024 CLINICAL DATA:  Status post bronchoscopy. EXAM: PORTABLE CHEST 1 VIEW COMPARISON:  October 29, 2015. FINDINGS: The heart size and mediastinal contours are within normal limits. Left lung is clear. Right-sided chest tube is noted without definite pneumothorax. Mild right basilar subsegmental atelectasis is noted. The visualized skeletal structures are unremarkable. IMPRESSION: Right-sided chest tube is noted without definite pneumothorax. Mild right basilar subsegmental atelectasis. Electronically Signed   By: Lynwood Landy Raddle M.D.   On: 05/22/2024 13:49   DG C-Arm 1-60 Min-No Report Result Date: 05/22/2024 Fluoroscopy was utilized by the requesting physician.  No radiographic interpretation.   NM PET Image Initial (PI) Skull Base To Thigh (F-18 FDG) Result Date: 05/04/2024 CLINICAL DATA:  Initial treatment strategy for lung nodule. EXAM: NUCLEAR MEDICINE PET SKULL BASE TO THIGH TECHNIQUE: 8.8 mCi F-18 FDG was injected intravenously. Full-ring PET imaging was performed from the skull base to thigh after the radiotracer. CT data was obtained and used for attenuation correction and anatomic localization. Fasting blood glucose: 109 mg/dl COMPARISON:  CT chest 94/80/7974, 12/30/2023, 12/24/2022. FINDINGS: Mediastinal blood pool activity: SUV max 2.1 Liver activity: SUV max NA NECK: No abnormal hypermetabolism. Incidental CT findings: None. CHEST: 10 mm anterior segment right upper lobe nodule (6/50), SUV max 3.0. Right axillary lymph  node does not show metabolism above blood pool. No additional abnormal hypermetabolism. Incidental CT findings: Atherosclerotic calcification of the aorta and aortic valve with age advanced involvement of all 3 coronary arteries. Heart is at the upper limits of normal in size to mildly enlarged. No pericardial or pleural effusion. Centrilobular and paraseptal emphysema. ABDOMEN/PELVIS: Hypermetabolism within a poorly distended cecum, SUV max 8.1. Otherwise, no additional abnormal hypermetabolism. Incidental CT findings: Small low-attenuation lesion in the right kidney, too small to characterize. No specific follow-up necessary. SKELETON: No abnormal hypermetabolism. Incidental CT findings: Degenerative changes in the spine. IMPRESSION: 1. 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 2. Cecal hypermetabolism. Malignancy cannot  be excluded. Recommend correlation with recent or future colonoscopy. 3.  Age advanced three-vessel coronary artery calcification. 4.  Aortic atherosclerosis (ICD10-I70.0). Electronically Signed   By: Newell Eke M.D.   On: 05/04/2024 10:01     Assessment/Plan 1. Atherosclerosis of native artery of both lower extremities with intermittent claudication (HCC) (Primary) Recommend:   The patient has evidence of atherosclerosis of the lower extremities with minimal claudication.  The patient does not voice lifestyle limiting changes at this point in time.  Based on his description and his history I believe that a significant portion of his leg pain is associated with his LS spine disease and sciatica.   Noninvasive studies do not suggest clinically significant change.   No invasive studies, angiography or surgery at this time The patient should continue walking and begin a more formal exercise program.  The patient should continue antiplatelet therapy and aggressive treatment of the lipid abnormalities   No  changes in the patient's medications at this time   Continued surveillance is indicated as atherosclerosis is likely to progress with time.     The patient will continue follow up with noninvasive studies as ordered.  - VAS US  ABI WITH/WO TBI; Future  2. Essential hypertension Continue antihypertensive medications as already ordered, these medications have been reviewed and there are no changes at this time.  3. Uncomplicated asthma, unspecified asthma severity, unspecified whether persistent Continue pulmonary medications and aerosols as already ordered, these medications have been reviewed and there are no changes at this time.   4. Mixed hyperlipidemia Continue statin as ordered and reviewed, no changes at this time  5. Coronary artery disease involving native coronary artery of native heart with angina pectoris (HCC) Continue cardiac and antihypertensive medications as already ordered and reviewed, no changes at this time.  Continue statin as ordered and reviewed, no changes at this time  Nitrates PRN for chest pain    Cordella Shawl, MD  05/24/2024 3:15 PM

## 2024-05-24 NOTE — Progress Notes (Signed)
 PULMONOLOGY         Date: 05/24/2024,   MRN# [id number] Dominic Ferguson 1960-12-12     AdmissionWeight: 81.2 kg                 CurrentWeight: 81.2 kg  Referring provider: Dr Theotis    CHIEF COMPLAINT:   Hypermetabolic RUL nodule with hilar adenopathy    HISTORY OF PRESENT ILLNESS   This is a 63 yo with hx of alcohol abuse, OA, tobacco smoking, COPD, dyslipidemia and dyspnea who was seen in clinic with [facility] pulmonary due to dyspnea. He was noted to have abnormal CT chest with Right upper lobe lung nodule.  He was treated with antibiotics and steroids but continued to have progressive dyspnea and persistence of RUL lesion.  He had PET scan with abnormally hypermetabolic RUL lesion and possible hilar/mediastinal adenopathy.  He is here today for bronchoscopy with airway inspection, BAL, therapeutic aspiration of tracheobronchial tree, lung biopsy and endobronchial ultrasound to assess lymph node stations.  He has no new complaints.  His wife is present and all additional questions have been answered. Reviewed risks/complications and benefits with patient, risks include infection, pneumothorax/pneumomediastinum which may require chest tube placement as well as overnight/prolonged hospitalization and possible mechanical ventilation. Other risks include bleeding and very rarely death.  Patient understands risks and wishes to proceed.  Additional questions were answered, and patient is aware that post procedure patient will be going home with family and may experience cough with possible clots on expectoration as well as phlegm which may last few days as well as hoarseness of voice post intubation and mechanical ventilation.  05/23/24- patient is stable, he's on 2L/min [place] saturating >90%.  We clamped chest tube this am and there is slight recurrence of apical right pneumothorax so we will keep patient ovenright on negative 10 suction. Will reclamp and re image in am for chest tube removal.    05/24/24- patient is doing well this am,  he is on room air and asking to go home.  We removed pleural catheter and repeated CXR. He is cleared for dc home and I will follow up with him in clinic in just 2 days.   PAST MEDICAL HISTORY   Past Medical History:  Diagnosis Date   Alcohol abuse    Aortic atherosclerosis (HCC)    Arthritis    Asthma    Atherosclerosis of native arteries of extremity with intermittent claudication (HCC)    COPD (chronic obstructive pulmonary disease) (HCC)    Coronary artery disease    a.) s/p PCI 10/03/2023: 99% oRCA (3.0 x 15 mm Frontier Onyx DES x 2)   DJD (degenerative joint disease), lumbosacral    DOE (dyspnea on exertion)    GERD (gastroesophageal reflux disease)    HLD (hyperlipidemia)    Hypertension    Long term current use of clopidogrel    Long-term use of aspirin  therapy    Lung nodules    Tobacco abuse      SURGICAL HISTORY   Past Surgical History:  Procedure Laterality Date   CORONARY STENT INTERVENTION N/A 10/03/2023   Procedure: CORONARY STENT INTERVENTION;  Surgeon: Florencio Cara BIRCH, MD;  Location: [facility];  Service: Cardiovascular;  Laterality: N/A;   LEFT HEART CATH AND CORONARY ANGIOGRAPHY Left 10/03/2023   Procedure: LEFT HEART CATH AND CORONARY ANGIOGRAPHY;  Surgeon: Florencio Cara BIRCH, MD;  Location: [facility];  Service: Cardiovascular;  Laterality: Left;   VIDEO BRONCHOSCOPY  WITH ENDOBRONCHIAL NAVIGATION N/A 05/22/2024   Procedure: VIDEO BRONCHOSCOPY WITH ENDOBRONCHIAL NAVIGATION;  Surgeon: Parris Manna, MD;  Location: [facility];  Service: Thoracic;  Laterality: N/A;   VIDEO BRONCHOSCOPY WITH ENDOBRONCHIAL ULTRASOUND N/A 05/22/2024   Procedure: BRONCHOSCOPY, WITH EBUS;  Surgeon: Parris Manna, MD;  Location: [facility];  Service: Thoracic;  Laterality: N/A;     FAMILY HISTORY   Family History  Problem Relation Age of Onset   Cancer Father    Heart disease Brother      SOCIAL HISTORY    Social History   Tobacco Use   Smoking status: Every Day    Current packs/day: 1.00    Types: Cigarettes   Smokeless tobacco: Never  Vaping Use   Vaping status: Never Used  Substance Use Topics   Alcohol use: Yes    Alcohol/week: 49.0 standard drinks of alcohol    Types: 49 Cans of beer per week    Comment: weekends   Drug use: Never     MEDICATIONS    Home Medication:    Current Medication:  Current Facility-Administered Medications:    acetaminophen  (TYLENOL ) tablet 650 mg, 650 mg, Oral, Q6H PRN, 650 mg at 05/23/24 1839 **OR** acetaminophen  (TYLENOL ) suppository 650 mg, 650 mg, Rectal, Q6H PRN, Cox, Amy N, DO   albuterol  (PROVENTIL ) (2.5 MG/3ML) 0.083% nebulizer solution 2.5 mg, 2.5 mg, Nebulization, Q6H PRN, Cox, Amy N, DO   amLODipine  (NORVASC ) tablet 10 mg, 10 mg, Oral, QPC lunch, Cox, Amy N, DO   aspirin  chewable tablet 81 mg, 81 mg, Oral, Daily, Cox, Amy N, DO, 81 mg at 05/24/24 1004   atorvastatin  (LIPITOR) tablet 40 mg, 40 mg, Oral, QPC lunch, Cox, Amy N, DO, 40 mg at 05/23/24 1327   famotidine  (PEPCID ) tablet 20 mg, 20 mg, Oral, Daily, Tramond Slinker, MD, 20 mg at 05/24/24 1004   heparin  injection 5,000 Units, 5,000 Units, Subcutaneous, Q8H, Cox, Amy N, DO, 5,000 Units at 05/24/24 0537   hydrALAZINE  (APRESOLINE ) injection 5 mg, 5 mg, Intravenous, Q6H PRN, Cox, Amy N, DO   isosorbide  mononitrate (IMDUR ) 24 hr tablet 30 mg, 30 mg, Oral, QPC lunch, Cox, Amy N, DO   losartan  (COZAAR ) tablet 25 mg, 25 mg, Oral, QPC lunch, Cox, Amy N, DO   metoprolol  succinate (TOPROL -XL) 24 hr tablet 12.5 mg, 12.5 mg, Oral, QPC lunch, Cox, Amy N, DO, 12.5 mg at 05/23/24 1327   morphine  (PF) 2 MG/ML injection 2 mg, 2 mg, Intravenous, Q4H PRN, Jhonny, Sudheer B, MD   nicotine  (NICODERM CQ  - dosed in mg/24 hours) patch 21 mg, 21 mg, Transdermal, Daily PRN, Cox, Amy N, DO, 21 mg at 05/23/24 1616   ondansetron  (ZOFRAN ) tablet 4 mg, 4 mg, Oral, Q6H PRN **OR** ondansetron  (ZOFRAN )  injection 4 mg, 4 mg, Intravenous, Q6H PRN, Cox, Amy N, DO   pantoprazole  (PROTONIX ) EC tablet 40 mg, 40 mg, Oral, Daily, Cox, Amy N, DO, 40 mg at 05/23/24 1327   senna-docusate (Senokot-S) tablet 1 tablet, 1 tablet, Oral, QHS PRN, Cox, Amy N, DO   umeclidinium bromide  (INCRUSE ELLIPTA ) 62.5 MCG/ACT 1 puff, 1 puff, Inhalation, Daily, Cox, Amy N, DO, 1 puff at 05/24/24 1004    ALLERGIES   Patient has no known allergies.     REVIEW OF SYSTEMS    Review of Systems:  Gen:  Denies  fever, sweats, chills weigh loss  HEENT: Denies blurred vision, double vision, ear pain, eye pain, hearing loss, nose bleeds, sore throat Cardiac:  No dizziness, chest  pain or heaviness, chest tightness,edema Resp:   reports dyspnea chronically  Gi: Denies swallowing difficulty, stomach pain, nausea or vomiting, diarrhea, constipation, bowel incontinence Gu:  Denies bladder incontinence, burning urine Ext:   Denies Joint pain, stiffness or swelling Skin: Denies  skin rash, easy bruising or bleeding or hives Endoc:  Denies polyuria, polydipsia , polyphagia or weight change Psych:   Denies depression, insomnia or hallucinations   Other:  All other systems negative   VS: BP 129/70 (BP Location: Left Arm)   Pulse 71   Temp 97.6 F (36.4 C)   Resp 18   Ht 5' 8 (1.727 m)   Wt 81.2 kg   SpO2 95%   BMI 27.22 kg/m      PHYSICAL EXAM    GENERAL:NAD, no fevers, chills, no weakness no fatigue HEAD: Normocephalic, atraumatic.  EYES: Pupils equal, round, reactive to light. Extraocular muscles intact. No scleral icterus.  MOUTH: Moist mucosal membrane. Dentition intact. No abscess noted.  EAR, NOSE, THROAT: Clear without exudates. No external lesions.  NECK: Supple. No thyromegaly. No nodules. No JVD.  PULMONARY: decreased breath sounds with mild rhonchi worse at bases bilaterally.  CARDIOVASCULAR: S1 and S2. Regular rate and rhythm. No murmurs, rubs, or gallops. No edema. Pedal pulses 2+ bilaterally.   GASTROINTESTINAL: Soft, nontender, nondistended. No masses. Positive bowel sounds. No hepatosplenomegaly.  MUSCULOSKELETAL: No swelling, clubbing, or edema. Range of motion full in all extremities.  NEUROLOGIC: Cranial nerves II through XII are intact. No gross focal neurological deficits. Sensation intact. Reflexes intact.  SKIN: No ulceration, lesions, rashes, or cyanosis. Skin warm and dry. Turgor intact.  PSYCHIATRIC: Mood, affect within normal limits. The patient is awake, alert and oriented x 3. Insight, judgment intact.       IMAGING   Narrative & Impression  CLINICAL DATA:  Initial treatment strategy for lung nodule.   EXAM: NUCLEAR MEDICINE PET SKULL BASE TO THIGH   TECHNIQUE: 8.8 mCi F-18 FDG was injected intravenously. Full-ring PET imaging was performed from the skull base to thigh after the radiotracer. CT data was obtained and used for attenuation correction and anatomic localization.   Fasting blood glucose: 109 mg/dl   COMPARISON:  CT chest 03/02/2024, 12/30/2023, 12/24/2022.   FINDINGS: Mediastinal blood pool activity: SUV max 2.1   Liver activity: SUV max NA   NECK:   No abnormal hypermetabolism.   Incidental CT findings:   None.   CHEST:   10 mm anterior segment right upper lobe nodule (6/50), SUV max 3.0. Right axillary lymph node does not show metabolism above blood pool. No additional abnormal hypermetabolism.   Incidental CT findings:   Atherosclerotic calcification of the aorta and aortic valve with age advanced involvement of all 3 coronary arteries. Heart is at the upper limits of normal in size to mildly enlarged. No pericardial or pleural effusion. Centrilobular and paraseptal emphysema.   ABDOMEN/PELVIS:   Hypermetabolism within a poorly distended cecum, SUV max 8.1. Otherwise, no additional abnormal hypermetabolism.   Incidental CT findings:   Small low-attenuation lesion in the right kidney, too small to characterize. No  specific follow-up necessary.   SKELETON:   No abnormal hypermetabolism.   Incidental CT findings: Degenerative changes in the spine.   IMPRESSION: 1. 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 2. Cecal hypermetabolism. Malignancy cannot be excluded. Recommend correlation with recent or future colonoscopy. 3.  Age advanced three-vessel coronary artery calcification. 4.  Aortic atherosclerosis (ICD10-I70.0).  Electronically Signed   By: Newell Eke M.D.   On: 05/04/2024 10:01    ASSESSMENT/PLAN   Right upper lobe hypermetabolic lesion with possible hilar/mediastinal adenopathy  -s/p bronchoscopy with small apical pneumothorax    - chest tube pigtail 38F in place -    - cxr with small persistent pneumothorax on right will keep on suction.          Thank you for allowing me to participate in the care of this patient.   Patient/Family are satisfied with care plan and all questions have been answered.    Provider disclosure: Patient with at least one acute or chronic illness or injury that poses a threat to life or bodily function and is being managed actively during this encounter.  All of the below services have been performed independently by signing provider:  review of prior documentation from internal and or external health records.  Review of previous and current lab results.  Interview and comprehensive assessment during patient visit today. Review of current and previous chest radiographs/CT scans. Discussion of management and test interpretation with health care team and patient/family.   This document was prepared using Dragon voice recognition software and may include unintentional dictation errors.     Rondel Episcopo, M.D.  Division of Pulmonary & Critical Care Medicine

## 2024-05-24 NOTE — Progress Notes (Signed)
 Discharge instructions reviewed with the patient. Patient sent down via wheelchair with belongings to waiting ride

## 2024-05-24 NOTE — Progress Notes (Signed)
 Brief note.  Full note to follow.  Small residual pneumothorax noted after clamping trial 8/9.  Chest tube remained in place.  Repeat chest x-ray on 8/10 shows near resolution of pneumothorax.  Will proceed with repeat clamping trial.  2 hours.  Check chest x-ray after 2-hour period.  Pulmonology to follow-up for further evaluation and consideration of chest tube removal.  If able to successfully remove chest tube and wean off small amount of oxygen patient may discharge later today.  Calvin Robson MD  No charge

## 2024-05-24 NOTE — Plan of Care (Signed)

## 2024-05-24 NOTE — Discharge Summary (Signed)
 Physician Discharge Summary  Dominic Ferguson FMW:[id number] DOB: [birth year] DOA: 05/22/2024  PCP: Epifanio Alm SQUIBB, MD  Admit date: 05/22/2024 Discharge date: 05/24/2024  Admitted From: Home Disposition:  Home  Recommendations for Outpatient Follow-up:  Follow up with PCP in 1-2 weeks Follow up pulmonary as directed  Home Health:No  Equipment/Devices:None   Discharge Condition:Stable  CODE STATUS:FULL  Diet recommendation: Reg  Brief/Interim Summary:  63 year old male with history of alcohol abuse, osteoarthritis, tobacco smoking, COPD, dyslipidemia, dyspnea, who is status post outpatient bronchoscopy in setting of right upper lobe lung nodule.    Chest tube was placed post bronchoscopy. Hospitalist service was consulted by pulmonologist for pneumothorax.    Chest tube initially improved after pulmonology evaluation we clamped chest tube for 2 hours repeat chest x-ray.  Pneumothorax is slightly worse.   Pneumothorax resolved on 8/10.  Chest tube removed.  Patient on room air.  Stable for discharge home.  Follow-up outpatient pulmonary as directed.    Discharge Diagnoses:  Principal Problem:   Pneumothorax on right Active Problems:   Asthma   DJD (degenerative joint disease), lumbosacral   DOE (dyspnea on exertion)   Essential hypertension   Hyperlipidemia   Pneumothorax  Pneumothorax Right-sided.  Status post bronchoscopy.  Chest tube placed by pulmonology.  Chest tube maintained until 8/10.  After radiographic resolution of pneumothorax chest tube was removed.  Patient on room air.  Stable for discharge home.  Follow-up outpatient pulmonary as directed.  Discharge Instructions  Discharge Instructions     Diet - low sodium heart healthy   Complete by: As directed    Increase activity slowly   Complete by: As directed       Allergies as of 05/24/2024   No Known Allergies      Medication List     TAKE these medications    amLODipine  10 MG tablet Commonly  known as: NORVASC  Take 10 mg by mouth daily after lunch.   aspirin  81 MG chewable tablet Chew 1 tablet (81 mg total) by mouth daily.   atorvastatin  40 MG tablet Commonly known as: LIPITOR Take 1 tablet (40 mg total) by mouth daily. What changed: when to take this   cholecalciferol  25 MCG (1000 UNIT) tablet Commonly known as: VITAMIN D3 Take 1,000 Units by mouth daily.   clopidogrel 75 MG tablet Commonly known as: PLAVIX Take 75 mg by mouth daily after lunch.   isosorbide  mononitrate 30 MG 24 hr tablet Commonly known as: IMDUR  Take 30 mg by mouth daily after lunch.   losartan  25 MG tablet Commonly known as: COZAAR  Take 1 tablet (25 mg total) by mouth daily. What changed: when to take this   metoprolol  succinate 25 MG 24 hr tablet Commonly known as: TOPROL -XL Take 0.5 tablets (12.5 mg total) by mouth daily. What changed: when to take this   multivitamin tablet Take 1 tablet by mouth daily.   omeprazole  20 MG tablet Commonly known as: PRILOSEC  OTC Take 20 mg by mouth daily after lunch.   tiotropium 18 MCG inhalation capsule Commonly known as: SPIRIVA  Place 18 mcg into inhaler and inhale daily.        Follow-up Information     Parris Manna, MD. Go on 06/08/2024.   Specialty: Pulmonary Disease Why: 8am postop appointment Contact information: [address] [place] [address] [phone]                No Known Allergies  Consultations: Pulmonology   Procedures/Studies: CT SUPER D CHEST WO  MONARCH PILOT Result Date: 05/24/2024 CLINICAL DATA:  Pulmonary nodules. EXAM: CT CHEST WITHOUT CONTRAST TECHNIQUE: Multidetector CT imaging of the chest was performed using thin slice collimation for electromagnetic bronchoscopy planning purposes, without intravenous contrast. RADIATION DOSE REDUCTION: This exam was performed according to the departmental dose-optimization program which includes automated exposure control, adjustment of the mA and/or  kV according to patient size and/or use of iterative reconstruction technique. COMPARISON:  PET-CT 05/04/2024 FINDINGS: Cardiovascular: The heart size is normal. No substantial pericardial effusion. Coronary artery calcification is evident. Mild atherosclerotic calcification is noted in the wall of the thoracic aorta. Mediastinum/Nodes: No mediastinal lymphadenopathy. 8 mm short axis low right paratracheal node is stable since CT 03/02/2024. No evidence for gross hilar lymphadenopathy although assessment is limited by the lack of intravenous contrast on the current study. The esophagus has normal imaging features. There is no axillary lymphadenopathy. Lungs/Pleura: Centrilobular and paraseptal emphysema evident. 10 mm anterior right lung nodule identified, demonstrated to be hypermetabolic on recent PET imaging. Additional scattered tiny pulmonary nodules are stable in the interval. No new suspicious pulmonary nodule or mass. No focal airspace consolidation. There is no evidence of pleural effusion. Upper Abdomen: Visualized portion of the upper abdomen shows no acute findings. Musculoskeletal: No worrisome lytic or sclerotic osseous abnormality. IMPRESSION: 1. 10 mm anterior right lung nodule, demonstrated to be hypermetabolic on recent PET imaging. 2. Additional scattered tiny pulmonary nodules are stable in the interval. 3. Aortic Atherosclerosis (ICD10-I70.0) and Emphysema (ICD10-J43.9). Electronically Signed   By: Camellia Candle M.D.   On: 05/24/2024 08:11   DG Chest Port 1 View Result Date: 05/24/2024 CLINICAL DATA:  Pneumothorax. EXAM: PORTABLE CHEST 1 VIEW COMPARISON:  05/23/2024 FINDINGS: Interval decrease in size of the right-sided pneumothorax with no discernible pleural line on the current study. Soft tissue gas in the right chest wall is progressive in the interval. No focal consolidation, pulmonary edema, or substantial pleural effusion. There is some minimal atelectasis in the lung bases as before.  The cardiopericardial silhouette is within normal limits for size. Telemetry leads overlie the chest. IMPRESSION: 1. Interval decrease in size of the right-sided pneumothorax with no discernible pleural line on the current study. 2. Progressive soft tissue gas in the right chest wall. Electronically Signed   By: Camellia Candle M.D.   On: 05/24/2024 08:07   DG Chest Port 1 View Result Date: 05/23/2024 CLINICAL DATA:  Pneumothorax. EXAM: PORTABLE CHEST 1 VIEW COMPARISON:  Earlier today. FINDINGS: Right pneumothorax is increased in size from earlier today, now moderate. Pigtail catheter is again seen coiled projecting over the lateral upper hemithorax. Increased subcutaneous emphysema in the right chest wall. There is no definite mediastinal shift. Increasing right infrahilar opacity. Mild left lung base atelectasis. IMPRESSION: 1. Right pneumothorax has increased in size from earlier today, now moderate. Increased subcutaneous emphysema in the right chest wall. Right-sided pigtail catheter remains in place. 2. Increasing right infrahilar opacity, likely atelectasis. Electronically Signed   By: Andrea Gasman M.D.   On: 05/23/2024 14:04   DG Chest Port 1 View Result Date: 05/23/2024 CLINICAL DATA:  Pneumothorax. EXAM: PORTABLE CHEST 1 VIEW COMPARISON:  [id number] FINDINGS: Right pleural drain again noted. Pleural line now identified in the right apex consistent with interval development of a tiny apical right pneumothorax. Dependent atelectasis noted bilaterally. No substantial pleural effusion. The cardiopericardial silhouette is within normal limits for size. Telemetry leads overlie the chest. IMPRESSION: Interval development of a tiny right apical pneumothorax. Right pleural drain remains in place.  Electronically Signed   By: Camellia Candle M.D.   On: 05/23/2024 08:02   DG Chest Port 1 View Result Date: 05/22/2024 CLINICAL DATA:  Status post bronchoscopy. EXAM: PORTABLE CHEST 1 VIEW COMPARISON:  October 29, 2015.  FINDINGS: The heart size and mediastinal contours are within normal limits. Left lung is clear. Right-sided chest tube is noted without definite pneumothorax. Mild right basilar subsegmental atelectasis is noted. The visualized skeletal structures are unremarkable. IMPRESSION: Right-sided chest tube is noted without definite pneumothorax. Mild right basilar subsegmental atelectasis. Electronically Signed   By: Lynwood Landy Raddle M.D.   On: 05/22/2024 13:49   DG C-Arm 1-60 Min-No Report Result Date: 05/22/2024 Fluoroscopy was utilized by the requesting physician.  No radiographic interpretation.   NM PET Image Initial (PI) Skull Base To Thigh (F-18 FDG) Result Date: 05/04/2024 CLINICAL DATA:  Initial treatment strategy for lung nodule. EXAM: NUCLEAR MEDICINE PET SKULL BASE TO THIGH TECHNIQUE: 8.8 mCi F-18 FDG was injected intravenously. Full-ring PET imaging was performed from the skull base to thigh after the radiotracer. CT data was obtained and used for attenuation correction and anatomic localization. Fasting blood glucose: 109 mg/dl COMPARISON:  CT chest 94/80/7974, 12/30/2023, 12/24/2022. FINDINGS: Mediastinal blood pool activity: SUV max 2.1 Liver activity: SUV max NA NECK: No abnormal hypermetabolism. Incidental CT findings: None. CHEST: 10 mm anterior segment right upper lobe nodule (6/50), SUV max 3.0. Right axillary lymph node does not show metabolism above blood pool. No additional abnormal hypermetabolism. Incidental CT findings: Atherosclerotic calcification of the aorta and aortic valve with age advanced involvement of all 3 coronary arteries. Heart is at the upper limits of normal in size to mildly enlarged. No pericardial or pleural effusion. Centrilobular and paraseptal emphysema. ABDOMEN/PELVIS: Hypermetabolism within a poorly distended cecum, SUV max 8.1. Otherwise, no additional abnormal hypermetabolism. Incidental CT findings: Small low-attenuation lesion in the right kidney, too small to  characterize. No specific follow-up necessary. SKELETON: No abnormal hypermetabolism. Incidental CT findings: Degenerative changes in the spine. IMPRESSION: 1. 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 2. Cecal hypermetabolism. Malignancy cannot be excluded. Recommend correlation with recent or future colonoscopy. 3.  Age advanced three-vessel coronary artery calcification. 4.  Aortic atherosclerosis (ICD10-I70.0). Electronically Signed   By: Newell Eke M.D.   On: 05/04/2024 10:01      Subjective: Seen and examined on the day of discharge.  Stable no distress.  Appropriate discharge home.  Discharge Exam: Vitals:   05/24/24 0953 05/24/24 1006  BP:    Pulse:    Resp:    Temp:    SpO2: 95% 95%   Vitals:   05/24/24 0348 05/24/24 0805 05/24/24 0953 05/24/24 1006  BP: 126/77 129/70    Pulse: 68 71    Resp: 16 18    Temp: 98 F (36.7 C) 97.6 F (36.4 C)    TempSrc:      SpO2: 95% 95% 95% 95%  Weight:      Height:        General: Pt is alert, awake, not in acute distress Cardiovascular: RRR, S1/S2 +, no rubs, no gallops Respiratory: CTA bilaterally, no wheezing, no rhonchi Abdominal: Soft, NT, ND, bowel sounds + Extremities: no edema, no cyanosis    The results of significant diagnostics from this hospitalization (including imaging, microbiology, ancillary and laboratory) are listed below for reference.     Microbiology: Recent Results (from the past 240 hours)  Culture, BAL-quantitative w Gram Stain  Status: None (Preliminary result)   Collection Time: 05/22/24 11:54 AM   Specimen: Bronchoalveolar Lavage; Respiratory  Result Value Ref Range Status   Specimen Description   Final    BRONCHIAL ALVEOLAR LAVAGE Performed at [facility], [address] [place], [place] [address]    Special Requests   Final    NONE Performed at [facility], [address] [place], [place]  [address]    Gram Stain NO WBC SEEN NO ORGANISMS SEEN   Final   Culture   Final    NO GROWTH < 24 HOURS Performed at [facility], [address] [place], [place] [address]    Report Status PENDING  Incomplete     Labs: BNP (last 3 results) No results for input(s): BNP in the last 8760 hours. Basic Metabolic Panel: Recent Labs  Lab 05/22/24 1627 05/23/24 0454  NA 139 137  K 3.7 3.9  CL 105 100  CO2 23 26  GLUCOSE 144* 125*  BUN 19 19  CREATININE 0.82 0.84  CALCIUM  9.2 9.5   Liver Function Tests: Recent Labs  Lab 05/22/24 1627  AST 23  ALT 18  ALKPHOS 76  BILITOT 1.1  PROT 7.2  ALBUMIN 4.6   No results for input(s): LIPASE, AMYLASE in the last 168 hours. No results for input(s): AMMONIA in the last 168 hours. CBC: Recent Labs  Lab 05/22/24 1627 05/23/24 0454  WBC 10.9* 10.5  NEUTROABS 9.6*  --   HGB 16.5 16.4  HCT 46.9 47.5  MCV 88.5 88.3  PLT 200 213   Cardiac Enzymes: No results for input(s): CKTOTAL, CKMB, CKMBINDEX, TROPONINI in the last 168 hours. BNP: Invalid input(s): POCBNP CBG: No results for input(s): GLUCAP in the last 168 hours. D-Dimer No results for input(s): DDIMER in the last 72 hours. Hgb A1c No results for input(s): HGBA1C in the last 72 hours. Lipid Profile No results for input(s): CHOL, HDL, LDLCALC, TRIG, CHOLHDL, LDLDIRECT in the last 72 hours. Thyroid function studies No results for input(s): TSH, T4TOTAL, T3FREE, THYROIDAB in the last 72 hours.  Invalid input(s): FREET3 Anemia work up No results for input(s): VITAMINB12, FOLATE, FERRITIN, TIBC, IRON, RETICCTPCT in the last 72 hours. Urinalysis No results found for: COLORURINE, APPEARANCEUR, LABSPEC, PHURINE, GLUCOSEU, HGBUR, BILIRUBINUR, KETONESUR, PROTEINUR, UROBILINOGEN, NITRITE, LEUKOCYTESUR Sepsis Labs Recent Labs  Lab 05/22/24 1627 05/23/24 0454  WBC 10.9* 10.5    Microbiology Recent Results (from the past 240 hours)  Culture, BAL-quantitative w Gram Stain     Status: None (Preliminary result)   Collection Time: 05/22/24 11:54 AM   Specimen: Bronchoalveolar Lavage; Respiratory  Result Value Ref Range Status   Specimen Description   Final    BRONCHIAL ALVEOLAR LAVAGE Performed at [facility], [address] [place], [place] [address]    Special Requests   Final    NONE Performed at [facility], [address] [place], [place] [address]    Gram Stain NO WBC SEEN NO ORGANISMS SEEN   Final   Culture   Final    NO GROWTH < 24 HOURS Performed at [facility], [address] [place], [place] [address]    Report Status PENDING  Incomplete     Time coordinating discharge: 40 minutes  SIGNED:   Calvin KATHEE Robson, MD  [facility] 05/24/2024, 11:16 AM Pager   If 7PM-7AM, please contact night-coverage

## 2024-05-25 ENCOUNTER — Encounter (INDEPENDENT_AMBULATORY_CARE_PROVIDER_SITE_OTHER): Payer: Self-pay | Admitting: Vascular Surgery

## 2024-05-25 ENCOUNTER — Ambulatory Visit (INDEPENDENT_AMBULATORY_CARE_PROVIDER_SITE_OTHER): Payer: BC Managed Care – PPO | Admitting: Vascular Surgery

## 2024-05-25 ENCOUNTER — Ambulatory Visit (INDEPENDENT_AMBULATORY_CARE_PROVIDER_SITE_OTHER): Payer: BC Managed Care – PPO

## 2024-05-25 VITALS — BP 159/91 | HR 73 | Resp 18 | Ht 68.0 in | Wt 174.0 lb

## 2024-05-25 DIAGNOSIS — E782 Mixed hyperlipidemia: Secondary | ICD-10-CM

## 2024-05-25 DIAGNOSIS — I10 Essential (primary) hypertension: Secondary | ICD-10-CM

## 2024-05-25 DIAGNOSIS — J45909 Unspecified asthma, uncomplicated: Secondary | ICD-10-CM | POA: Diagnosis not present

## 2024-05-25 DIAGNOSIS — I25119 Atherosclerotic heart disease of native coronary artery with unspecified angina pectoris: Secondary | ICD-10-CM

## 2024-05-25 DIAGNOSIS — I70213 Atherosclerosis of native arteries of extremities with intermittent claudication, bilateral legs: Secondary | ICD-10-CM | POA: Diagnosis not present

## 2024-05-25 LAB — CULTURE, BAL-QUANTITATIVE W GRAM STAIN
Culture: NO GROWTH
Gram Stain: NONE SEEN

## 2024-05-25 LAB — VAS US ABI WITH/WO TBI
Left ABI: 1.09
Right ABI: 0.87

## 2024-05-26 LAB — SURGICAL PATHOLOGY

## 2024-05-26 LAB — CYTOLOGY - NON PAP

## 2024-05-26 NOTE — Progress Notes (Signed)
 DIVISION OF PULMONARY AND CRITICAL CARE MEDICINE                              FOLLOW UP ENCOUNTER     Chief complaint: COPD with right upper lobe hypermetabolic lung nodule  History of Present Illness Dominic Ferguson is a 63 year old male with COPD who presents with a right upper lobe lung nodule.  He recently underwent a procedure that resulted in hospitalization due to an unexpected complication. He states his breathing is 'okay' with occasional shortness of breath.  He is actively trying to quit smoking, recognizing it as a significant factor in his lung health. He has been reducing his cigarette consumption and is seeking assistance to quit entirely. Previous attempts with nicotine  patches were unsuccessful due to poor adhesion. He is considering alternative nicotine  replacement options such as nicotine  pouches and vaping devices to manage cravings.  He has a long history of smoking, which he describes as a habitual activity rather than a physical dependency, noting no significant withdrawal symptoms. Smoking is part of his routine, particularly after meals and in the morning.  He finds inspiration in a family member who quit smoking and drinking 'cold [place]' due to liver problems. He acknowledges the need for willpower to quit smoking and is motivated by his current lung health, including the presence of a nodule and the risk of cancer.   Past Medical History:   Past Medical History:  Diagnosis Date  . COPD (chronic obstructive pulmonary disease) (CMS/HHS-HCC)     Past Surgical History:  No past surgical history on file.  Allergies:  No Known Allergies  Current Medications:   Prior to Admission medications  Medication Sig Taking? Last Dose  albuterol  MDI, PROVENTIL , VENTOLIN , PROAIR , HFA 90 mcg/actuation inhaler INHALE 2 INHALATIONS INTO THE LUNGS EVERY 6 HOURS AS NEEDED Yes Taking  amLODIPine  (NORVASC ) 10 MG tablet TAKE 1 TABLET BY MOUTH  EVERY DAY Yes Taking  aspirin  81 MG EC tablet Take 1 tablet (81 mg total) by mouth once daily Yes Taking  atorvastatin  (LIPITOR) 40 MG tablet Take 1 tablet (40 mg total) by mouth once daily Yes Taking  cholecalciferol  (VITAMIN D3) 1000 unit capsule Take 1,000 Units by mouth once daily Yes Taking  clopidogreL (PLAVIX) 75 mg tablet Take 1 tablet (75 mg total) by mouth once daily Yes Taking  isosorbide  mononitrate (IMDUR ) 30 MG ER tablet Take 1 tablet (30 mg total) by mouth once daily Yes Taking  losartan  (COZAAR ) 25 MG tablet Take 1 tablet (25 mg total) by mouth once daily Yes Taking  metoprolol  SUCCinate (TOPROL -XL) 25 MG XL tablet TAKE 1/2 TABLET BY MOUTH ONCE DAILY Yes Taking  tiotropium (SPIRIVA ) 18 mcg inhalation capsule Place 1 capsule (18 mcg total) into inhaler and inhale once daily Yes Taking  varenicline tartrate (CHANTIX) 1 mg tablet TAKE 1 TABLET BY MOUTH TWICE A DAY Yes Taking    Family History:   Family History  Problem Relation Name Age of Onset  . Stroke Mother    . Cancer Father      Social History:   Social History   Socioeconomic History  . Marital status: Single  Tobacco Use  . Smoking status: Every Day    Current packs/day: 1.00  Average packs/day: 1 pack/day for 40.0 years (40.0 ttl pk-yrs)    Types: Cigarettes  . Smokeless tobacco: Never  Vaping Use  . Vaping status: Never Used  Substance and Sexual Activity  . Alcohol use: Yes    Alcohol/week: 12.0 standard drinks of alcohol    Types: 12 Cans of beer per week   Social Drivers of Health   Financial Resource Strain: Low Risk  (01/27/2024)   Overall Financial Resource Strain (CARDIA)   . Difficulty of Paying Living Expenses: Not hard at all  Food Insecurity: No Food Insecurity (05/22/2024)   [facility] from [facility]   Hunger Vital Sign   . Within the past 12 months, you worried that your food would run out before you got the money to buy more.: Never true   . Within the past 12 months, the food you  bought just didn't last and you didn't have money to get more.: Never true  Transportation Needs: No Transportation Needs (05/22/2024)   Received from [facility] - Transportation   . In the past 12 months, has lack of transportation kept you from medical appointments or from getting medications?: No   . In the past 12 months, has lack of transportation kept you from meetings, work, or from getting things needed for daily living?: No  Housing Stability: Low Risk  (01/27/2024)   Housing Stability Vital Sign   . Unable to Pay for Housing in the Last Year: No   . Number of Times Moved in the Last Year: 0   . Homeless in the Last Year: No    Review of Systems:   A 10 point review of systems is negative, except for the pertinent positives and negatives detailed in the HPI.  Vitals:   Vitals:   05/26/24 1202  BP: 128/77  BP Location: Left upper arm  Patient Position: Sitting  BP Cuff Size: Adult  Pulse: 70  SpO2: 93%  Weight: 79.4 kg (175 lb)  Height: 172.7 cm (5' 8)     Body mass index is 26.61 kg/m.  Physical Exam:   Physical Exam Vitals and nursing note reviewed.  Constitutional:      General: in no acute distress.    Appearance: Normal appearance. Is not ill-appearing, toxic-appearing or diaphoretic.  HENT:     Head: Normocephalic and atraumatic.     Right Ear: External ear normal.     Left Ear: External ear normal.  Eyes:     General:        Right eye: No discharge.        Left eye: No discharge.     Extraocular Movements: Extraocular movements intact.     Pupils: Pupils are equal, round, and reactive to light.  Cardiovascular:     Rate and Rhythm: Normal rate and regular rhythm.     Pulses: Normal pulses.     Heart sounds: Normal heart sounds. No murmur heard.    No friction rub. No gallop.  Abdominal:     General: Bowel sounds are normal.  Skin:    General: Skin is warm and dry.     Capillary Refill: Capillary refill takes less than 2 seconds.   Neurological:     Mental Status: Patient is alert.     Lab and Imaging Results:   Results RADIOLOGY Right upper lobe nodule: Nodule in right upper lobe of lung, described as cavitary and destructive.    Assessment and Plan:   Diagnoses and all orders  for this visit:  Chronic obstructive pulmonary disease, unspecified COPD type (CMS/HHS-HCC)  Pneumothorax on right  Nodule of upper lobe of right lung  Tobacco dependence    Assessment & Plan Right upper lobe lung nodule The right upper lobe lung nodule represents deteriorating lung tissue, potentially due to infection or cancer, causing respiratory issues. Radiation therapy is proposed to ablate the nodule, avoiding surgery and further cancer treatment. The procedure is non-invasive, using a microwaved laser to ablate the affected area without external incisions. There is a 2% risk of complications, as previously experienced by him. - Refer to radiation oncologist for radiation therapy to ablate the nodule.  Chronic obstructive pulmonary disease (COPD) COPD is present but currently well-managed. Breathing is generally adequate, though there are occasional difficulties. Smoking cessation is critical to prevent further lung damage and exacerbation of COPD.  Nicotine  dependence (tobacco use disorder) Nicotine  dependence is contributing to lung damage and the development of nodules. Smoking cessation is crucial to prevent further deterioration of lung health and potential cancer development. Discussed various smoking cessation aids and strategies, including nicotine  pouches and vaping as a temporary bridge to quitting. Emphasized the importance of willpower and personal commitment to quitting smoking. - Recommend nicotine  pouches available at gas stations to curb cigarette cravings. - Suggest using small vape devices as a temporary bridge to change smoking habits. - Emphasize the importance of willpower and personal commitment to  quitting smoking.     I spent a total of 41 minutes in both face-to-face and non-face-to-face activities, excluding procedures performed, for this visit on the date of this encounter.   This note has been created using dictation software tool and any typographical errors are purely unintentional.  Patient received an After Visit Summary

## 2024-05-28 ENCOUNTER — Ambulatory Visit
Admission: RE | Admit: 2024-05-28 | Discharge: 2024-05-28 | Disposition: A | Discharge status: Home / Self Care | Source: Ambulatory Visit | Attending: Radiation Oncology | Admitting: Radiation Oncology

## 2024-05-28 ENCOUNTER — Encounter: Payer: Self-pay | Admitting: Radiation Oncology

## 2024-05-28 VITALS — BP 156/86 | HR 69 | Temp 98.3°F | Resp 16 | Wt 172.0 lb

## 2024-05-28 DIAGNOSIS — I10 Essential (primary) hypertension: Secondary | ICD-10-CM | POA: Diagnosis not present

## 2024-05-28 DIAGNOSIS — M129 Arthropathy, unspecified: Secondary | ICD-10-CM | POA: Diagnosis not present

## 2024-05-28 DIAGNOSIS — F1010 Alcohol abuse, uncomplicated: Secondary | ICD-10-CM | POA: Insufficient documentation

## 2024-05-28 DIAGNOSIS — E785 Hyperlipidemia, unspecified: Secondary | ICD-10-CM | POA: Diagnosis not present

## 2024-05-28 DIAGNOSIS — K219 Gastro-esophageal reflux disease without esophagitis: Secondary | ICD-10-CM | POA: Insufficient documentation

## 2024-05-28 DIAGNOSIS — J449 Chronic obstructive pulmonary disease, unspecified: Secondary | ICD-10-CM | POA: Insufficient documentation

## 2024-05-28 DIAGNOSIS — Z7982 Long term (current) use of aspirin: Secondary | ICD-10-CM | POA: Diagnosis not present

## 2024-05-28 DIAGNOSIS — F17210 Nicotine dependence, cigarettes, uncomplicated: Secondary | ICD-10-CM | POA: Diagnosis not present

## 2024-05-28 DIAGNOSIS — Z79899 Other long term (current) drug therapy: Secondary | ICD-10-CM | POA: Diagnosis not present

## 2024-05-28 DIAGNOSIS — Z7902 Long term (current) use of antithrombotics/antiplatelets: Secondary | ICD-10-CM | POA: Diagnosis not present

## 2024-05-28 DIAGNOSIS — I2510 Atherosclerotic heart disease of native coronary artery without angina pectoris: Secondary | ICD-10-CM | POA: Diagnosis not present

## 2024-05-28 DIAGNOSIS — Z510 Encounter for antineoplastic radiation therapy: Secondary | ICD-10-CM | POA: Insufficient documentation

## 2024-05-28 DIAGNOSIS — I700 Atherosclerosis of aorta: Secondary | ICD-10-CM | POA: Insufficient documentation

## 2024-05-28 DIAGNOSIS — R911 Solitary pulmonary nodule: Secondary | ICD-10-CM | POA: Diagnosis present

## 2024-05-28 DIAGNOSIS — Z800 Family history of malignant neoplasm of digestive organs: Secondary | ICD-10-CM | POA: Diagnosis not present

## 2024-05-28 NOTE — Consult Note (Signed)
 NEW PATIENT EVALUATION  Name: Dominic Ferguson  MRN: [id number]  Date:   05/28/2024     DOB: 10-16-1960   This 63 y.o. male patient presents to the clinic for initial evaluation of presumed stage I non-small cell lung cancer of the right upper lobe.  REFERRING PHYSICIAN: Parris Manna, MD  CHIEF COMPLAINT:  Chief Complaint  Patient presents with   Pulmonary lung nodule    DIAGNOSIS: The encounter diagnosis was Pulmonary nodule, right.   PREVIOUS INVESTIGATIONS:  Serial CT scans and PET CT scan reviewed Clinical notes reviewed Pathology cytology review notes reviewed  HPI: Patient is a 63 year old male who presented with abnormal findings on screening CT scan of his chest.  At that time there were lung RADS 4B suspicious showing a right upper lobe lesion.  Over time this persisted and grew.  PET CT scan was performed showing hypermetabolic activity in this nodule.  Initial screening CT scan was back in March this month.  Patient underwent navigational bronchoscopy although no definitive diagnosis of malignancy was obtained this is a hard lesion to biopsy and he underwent hospitalization secondary to his navigational bronchoscopy.  He is a longtime smoker continues to smoke.  He does have COPD.  I been requested by pulmonology to evaluate the patient for possible SBRT treatment.  He specifically Nuys hemoptysis dysphagia at this time.  PLANNED TREATMENT REGIMEN: SBRT treatment  PAST MEDICAL HISTORY:  has a past medical history of Alcohol abuse, Aortic atherosclerosis (HCC), Arthritis, Asthma, Atherosclerosis of native arteries of extremity with intermittent claudication (HCC), COPD (chronic obstructive pulmonary disease) (HCC), Coronary artery disease, DJD (degenerative joint disease), lumbosacral, DOE (dyspnea on exertion), GERD (gastroesophageal reflux disease), HLD (hyperlipidemia), Hypertension, Long term current use of clopidogrel, Long-term use of aspirin  therapy, Lung nodules,  and Tobacco abuse.    PAST SURGICAL HISTORY:  Past Surgical History:  Procedure Laterality Date   CORONARY STENT INTERVENTION N/A 10/03/2023   Procedure: CORONARY STENT INTERVENTION;  Surgeon: Florencio Cara BIRCH, MD;  Location: [facility];  Service: Cardiovascular;  Laterality: N/A;   LEFT HEART CATH AND CORONARY ANGIOGRAPHY Left 10/03/2023   Procedure: LEFT HEART CATH AND CORONARY ANGIOGRAPHY;  Surgeon: Florencio Cara BIRCH, MD;  Location: [facility];  Service: Cardiovascular;  Laterality: Left;   VIDEO BRONCHOSCOPY WITH ENDOBRONCHIAL NAVIGATION N/A 05/22/2024   Procedure: VIDEO BRONCHOSCOPY WITH ENDOBRONCHIAL NAVIGATION;  Surgeon: Parris Manna, MD;  Location: [facility];  Service: Thoracic;  Laterality: N/A;   VIDEO BRONCHOSCOPY WITH ENDOBRONCHIAL ULTRASOUND N/A 05/22/2024   Procedure: BRONCHOSCOPY, WITH EBUS;  Surgeon: Parris Manna, MD;  Location: [facility];  Service: Thoracic;  Laterality: N/A;    FAMILY HISTORY: family history includes Cancer in his father; Heart disease in his brother.  SOCIAL HISTORY:  reports that he has been smoking cigarettes. He has never used smokeless tobacco. He reports current alcohol use of about 49.0 standard drinks of alcohol per week. He reports that he does not use drugs.  ALLERGIES: Patient has no known allergies.  MEDICATIONS:  Current Outpatient Medications  Medication Sig Dispense Refill   amLODipine  (NORVASC ) 10 MG tablet Take 10 mg by mouth daily after lunch.     aspirin  81 MG chewable tablet Chew 1 tablet (81 mg total) by mouth daily. 30 tablet 0   atorvastatin  (LIPITOR) 40 MG tablet Take 1 tablet (40 mg total) by mouth daily. 30 tablet 0   cholecalciferol  (VITAMIN D3) 25 MCG (1000 UNIT) tablet Take 1,000 Units by mouth daily.  clopidogrel (PLAVIX) 75 MG tablet Take 75 mg by mouth daily after lunch.     isosorbide  mononitrate (IMDUR ) 30 MG 24 hr tablet Take 30 mg by mouth daily after lunch.     losartan  (COZAAR ) 25 MG  tablet Take 1 tablet (25 mg total) by mouth daily. 30 tablet 0   metoprolol  succinate (TOPROL -XL) 25 MG 24 hr tablet Take 0.5 tablets (12.5 mg total) by mouth daily. 15 tablet 0   Multiple Vitamin (MULTIVITAMIN) tablet Take 1 tablet by mouth daily.     omeprazole  (PRILOSEC  OTC) 20 MG tablet Take 20 mg by mouth daily after lunch.     tiotropium (SPIRIVA ) 18 MCG inhalation capsule Place 18 mcg into inhaler and inhale daily.     No current facility-administered medications for this encounter.    ECOG PERFORMANCE STATUS:  0 - Asymptomatic  REVIEW OF SYSTEMS: Patient does have COPD. Patient denies any weight loss, fatigue, weakness, fever, chills or night sweats. Patient denies any loss of vision, blurred vision. Patient denies any ringing  of the ears or hearing loss. No irregular heartbeat. Patient denies heart murmur or history of fainting. Patient denies any chest pain or pain radiating to her upper extremities. Patient denies any shortness of breath, difficulty breathing at night, cough or hemoptysis. Patient denies any swelling in the lower legs. Patient denies any nausea vomiting, vomiting of blood, or coffee ground material in the vomitus. Patient denies any stomach pain. Patient states has had normal bowel movements no significant constipation or diarrhea. Patient denies any dysuria, hematuria or significant nocturia. Patient denies any problems walking, swelling in the joints or loss of balance. Patient denies any skin changes, loss of hair or loss of weight. Patient denies any excessive worrying or anxiety or significant depression. Patient denies any problems with insomnia. Patient denies excessive thirst, polyuria, polydipsia. Patient denies any swollen glands, patient denies easy bruising or easy bleeding. Patient denies any recent infections, allergies or URI. Patient s visual fields have not changed significantly in recent time.   PHYSICAL EXAM: BP (!) 156/86   Pulse 69   Temp 98.3 F  (36.8 C) (Tympanic)   Resp 16   Wt 172 lb (78 kg)   BMI 26.15 kg/m  Well-developed well-nourished patient in NAD. HEENT reveals PERLA, EOMI, discs not visualized.  Oral cavity is clear. No oral mucosal lesions are identified. Neck is clear without evidence of cervical or supraclavicular adenopathy. Lungs are clear to A&P. Cardiac examination is essentially unremarkable with regular rate and rhythm without murmur rub or thrill. Abdomen is benign with no organomegaly or masses noted. Motor sensory and DTR levels are equal and symmetric in the upper and lower extremities. Cranial nerves II through XII are grossly intact. Proprioception is intact. No peripheral adenopathy or edema is identified. No motor or sensory levels are noted. Crude visual fields are within normal range.  LABORATORY DATA: Cytology and pathology reports reviewed compatible with above-stated findings    RADIOLOGY RESULTS: Serial CT scans and PET scan reviewed compatible with above-stated findings   IMPRESSION: Stage I non-small cell lung cancer of the right upper lobe in a 63 year old male  PLAN: At this time I have offered continued observation of this lesion.  It is persistent over time excluding infected inflammatory nature.  It is PET positive has all hallmarks of malignancy.  I have offered SBRT treatment 60 Gray in 5 fractions.  Risks and benefits of treatment occluding extreme low side effect profile of SBRT was discussed with  the patient.  Patient has consented to treatment.  I have personally set up and ordered CT simulation for next week.  I would like to take this opportunity to thank you for allowing me to participate in the care of your patient.SABRA Marcey Penton, MD

## 2024-06-08 ENCOUNTER — Ambulatory Visit
Admission: RE | Admit: 2024-06-08 | Discharge: 2024-06-08 | Disposition: A | Discharge status: Home / Self Care | Source: Ambulatory Visit | Attending: Radiation Oncology | Admitting: Radiation Oncology

## 2024-06-08 DIAGNOSIS — R911 Solitary pulmonary nodule: Secondary | ICD-10-CM | POA: Diagnosis not present

## 2024-06-09 DIAGNOSIS — R911 Solitary pulmonary nodule: Secondary | ICD-10-CM | POA: Diagnosis not present

## 2024-06-22 ENCOUNTER — Other Ambulatory Visit: Payer: Self-pay

## 2024-06-22 ENCOUNTER — Ambulatory Visit
Admission: RE | Admit: 2024-06-22 | Discharge: 2024-06-22 | Disposition: A | Discharge status: Home / Self Care | Source: Ambulatory Visit | Attending: Radiation Oncology | Admitting: Radiation Oncology

## 2024-06-22 DIAGNOSIS — M129 Arthropathy, unspecified: Secondary | ICD-10-CM | POA: Insufficient documentation

## 2024-06-22 DIAGNOSIS — I700 Atherosclerosis of aorta: Secondary | ICD-10-CM | POA: Diagnosis not present

## 2024-06-22 DIAGNOSIS — J449 Chronic obstructive pulmonary disease, unspecified: Secondary | ICD-10-CM | POA: Diagnosis not present

## 2024-06-22 DIAGNOSIS — Z510 Encounter for antineoplastic radiation therapy: Secondary | ICD-10-CM | POA: Diagnosis not present

## 2024-06-22 DIAGNOSIS — I2510 Atherosclerotic heart disease of native coronary artery without angina pectoris: Secondary | ICD-10-CM | POA: Diagnosis not present

## 2024-06-22 DIAGNOSIS — Z79899 Other long term (current) drug therapy: Secondary | ICD-10-CM | POA: Insufficient documentation

## 2024-06-22 DIAGNOSIS — K219 Gastro-esophageal reflux disease without esophagitis: Secondary | ICD-10-CM | POA: Diagnosis not present

## 2024-06-22 DIAGNOSIS — Z7982 Long term (current) use of aspirin: Secondary | ICD-10-CM | POA: Insufficient documentation

## 2024-06-22 DIAGNOSIS — F1010 Alcohol abuse, uncomplicated: Secondary | ICD-10-CM | POA: Insufficient documentation

## 2024-06-22 DIAGNOSIS — E785 Hyperlipidemia, unspecified: Secondary | ICD-10-CM | POA: Diagnosis not present

## 2024-06-22 DIAGNOSIS — R911 Solitary pulmonary nodule: Secondary | ICD-10-CM | POA: Diagnosis present

## 2024-06-22 DIAGNOSIS — Z7902 Long term (current) use of antithrombotics/antiplatelets: Secondary | ICD-10-CM | POA: Diagnosis not present

## 2024-06-22 DIAGNOSIS — F17210 Nicotine dependence, cigarettes, uncomplicated: Secondary | ICD-10-CM | POA: Diagnosis not present

## 2024-06-22 DIAGNOSIS — Z800 Family history of malignant neoplasm of digestive organs: Secondary | ICD-10-CM | POA: Insufficient documentation

## 2024-06-22 DIAGNOSIS — I10 Essential (primary) hypertension: Secondary | ICD-10-CM | POA: Diagnosis not present

## 2024-06-22 LAB — RAD ONC ARIA SESSION SUMMARY
Course Elapsed Days: 0
Plan Fractions Treated to Date: 1
Plan Prescribed Dose Per Fraction: 18 Gy
Plan Total Fractions Prescribed: 3
Plan Total Prescribed Dose: 54 Gy
Reference Point Dosage Given to Date: 18 Gy
Reference Point Session Dosage Given: 18 Gy
Session Number: 1

## 2024-06-24 ENCOUNTER — Ambulatory Visit
Admission: RE | Admit: 2024-06-24 | Discharge: 2024-06-24 | Disposition: A | Discharge status: Home / Self Care | Source: Ambulatory Visit | Attending: Radiation Oncology | Admitting: Radiation Oncology

## 2024-06-24 ENCOUNTER — Other Ambulatory Visit: Payer: Self-pay

## 2024-06-24 DIAGNOSIS — R911 Solitary pulmonary nodule: Secondary | ICD-10-CM | POA: Diagnosis not present

## 2024-06-24 LAB — RAD ONC ARIA SESSION SUMMARY
Course Elapsed Days: 2
Plan Fractions Treated to Date: 2
Plan Prescribed Dose Per Fraction: 18 Gy
Plan Total Fractions Prescribed: 3
Plan Total Prescribed Dose: 54 Gy
Reference Point Dosage Given to Date: 36 Gy
Reference Point Session Dosage Given: 18 Gy
Session Number: 2

## 2024-06-29 ENCOUNTER — Other Ambulatory Visit: Payer: Self-pay

## 2024-06-29 ENCOUNTER — Ambulatory Visit
Admission: RE | Admit: 2024-06-29 | Discharge: 2024-06-29 | Disposition: A | Discharge status: Home / Self Care | Source: Ambulatory Visit | Attending: Radiation Oncology | Admitting: Radiation Oncology

## 2024-06-29 DIAGNOSIS — R911 Solitary pulmonary nodule: Secondary | ICD-10-CM | POA: Diagnosis not present

## 2024-06-29 LAB — RAD ONC ARIA SESSION SUMMARY
Course Elapsed Days: 7
Plan Fractions Treated to Date: 3
Plan Prescribed Dose Per Fraction: 18 Gy
Plan Total Fractions Prescribed: 3
Plan Total Prescribed Dose: 54 Gy
Reference Point Dosage Given to Date: 54 Gy
Reference Point Session Dosage Given: 18 Gy
Session Number: 3

## 2024-06-30 NOTE — Radiation Completion Notes (Signed)
 Patient Name: [name], Dominic Ferguson MRN: [id number] Date of Birth: 07-15-1961 Referring Physician: HALINA PICKING, M.D. Date of Service: 2024-06-30 Radiation Oncologist: Marcey Penton, M.D. [facility]                             RADIATION ONCOLOGY END OF TREATMENT NOTE     Diagnosis: R91.1 Solitary pulmonary nodule right lung Intent: Curative     HPI: Patient is a 63 year old male who presented with abnormal findings on screening CT scan of his chest.  At that time there were lung RADS 4B suspicious showing a right upper lobe lesion.  Over time this persisted and grew.  PET CT scan was performed showing hypermetabolic activity in this nodule.  Initial screening CT scan was back in March this month.  Patient underwent navigational bronchoscopy although no definitive diagnosis of malignancy was obtained this is a hard lesion to biopsy and he underwent hospitalization secondary to his navigational bronchoscopy.  He is a longtime smoker continues to smoke.  He does have COPD.  I been requested by pulmonology to evaluate the patient for possible SBRT treatment.  He specifically Nuys hemoptysis dysphagia at this time.      ==========DELIVERED PLANS==========  First Treatment Date: 2024-06-22 Last Treatment Date: 2024-06-29   Plan Name: Lung_R_SBRT Site: Lung, Right Technique: SBRT/SRT-IMRT Mode: Photon Dose Per Fraction: 18 Gy Prescribed Dose (Delivered / Prescribed): 54 Gy / 54 Gy Prescribed Fxs (Delivered / Prescribed): 3 / 3     ==========ON TREATMENT VISIT DATES========== 2024-06-22, 2024-06-24, 2024-06-29, 2024-06-29     ==========UPCOMING VISITS========== 07/27/2024 [facility] RAD ONCOLOGY FOLLOW UP 30 Chrystal, Glenn, MD        ==========APPENDIX - ON TREATMENT VISIT NOTES==========   See weekly On Treatment Notes in Epic for details in the Media tab (listed as Progress notes on the On Treatment Visit Dates listed above).

## 2024-07-27 ENCOUNTER — Encounter: Payer: Self-pay | Admitting: Radiation Oncology

## 2024-07-27 ENCOUNTER — Ambulatory Visit
Admission: RE | Admit: 2024-07-27 | Discharge: 2024-07-27 | Disposition: A | Discharge status: Home / Self Care | Source: Ambulatory Visit | Attending: Radiation Oncology | Admitting: Radiation Oncology

## 2024-07-27 ENCOUNTER — Other Ambulatory Visit: Payer: Self-pay | Admitting: *Deleted

## 2024-07-27 VITALS — BP 152/75 | HR 70 | Temp 98.0°F | Resp 16 | Ht 68.0 in | Wt 174.5 lb

## 2024-07-27 DIAGNOSIS — R911 Solitary pulmonary nodule: Secondary | ICD-10-CM

## 2024-07-27 NOTE — Progress Notes (Signed)
 Radiation Oncology Follow up Note  Name: Dominic Ferguson   Date:   07/27/2024 MRN:  [id number] DOB: [birth year]    This 63 y.o. male presents to the clinic today for 1 month follow-up status post SBRT radiation therapy.  To his right upper lobe for stage I non-small cell lung cancer  REFERRING PROVIDER: Epifanio Alm SQUIBB, MD  HPI: Patient is a 63 year old male now out 1 month having completed SBRT to his right upper lobe for stage I non-small cell lung cancer.  Seen today in routine follow-up he is doing well specifically his cough hemoptysis chest tightness or any change in his pulmonary status.  COMPLICATIONS OF TREATMENT: none  FOLLOW UP COMPLIANCE: keeps appointments   PHYSICAL EXAM:  BP (!) 152/75   Pulse 70   Temp 98 F (36.7 C)   Resp 16   Ht 5' 8 (1.727 m)   Wt 174 lb 8 oz (79.2 kg)   BMI 26.53 kg/m  Well-developed well-nourished patient in NAD. HEENT reveals PERLA, EOMI, discs not visualized.  Oral cavity is clear. No oral mucosal lesions are identified. Neck is clear without evidence of cervical or supraclavicular adenopathy. Lungs are clear to A&P. Cardiac examination is essentially unremarkable with regular rate and rhythm without murmur rub or thrill. Abdomen is benign with no organomegaly or masses noted. Motor sensory and DTR levels are equal and symmetric in the upper and lower extremities. Cranial nerves II through XII are grossly intact. Proprioception is intact. No peripheral adenopathy or edema is identified. No motor or sensory levels are noted. Crude visual fields are within normal range.  RADIOLOGY RESULTS: CT scan of his chest ordered in 3 months  PLAN: Present time patient is doing well very low side effect profile status post SBRT to his chest.  And pleased with his overall progress.  Of asked to see him back in 3 months with a follow-up CT scan at that time.  Patient knows to call with any concerns.  I would like to take this opportunity to thank you  for allowing me to participate in the care of your patient.SABRA Marcey Penton, MD

## 2024-07-29 ENCOUNTER — Encounter: Payer: Self-pay | Admitting: Radiation Oncology

## 2024-07-29 ENCOUNTER — Telehealth: Payer: Self-pay | Admitting: Radiation Oncology

## 2024-07-29 NOTE — Telephone Encounter (Signed)
 Pt called and requested to move CT to early Monday morning instead of Tuesday because of work schedule - changed appt w/pt and mailed out appt reminder - [facility]

## 2024-10-07 ENCOUNTER — Encounter: Payer: Self-pay | Admitting: Radiation Oncology

## 2024-10-07 ENCOUNTER — Telehealth: Payer: Self-pay | Admitting: Radiation Oncology

## 2024-10-07 NOTE — Telephone Encounter (Signed)
 Called for appt reminder for Monday CT - no answer and no vm set up - sent appt reminder via mychart since pt was recently active - [facility]

## 2024-10-12 ENCOUNTER — Ambulatory Visit
Admission: RE | Admit: 2024-10-12 | Discharge: 2024-10-12 | Disposition: A | Discharge status: Home / Self Care | Source: Ambulatory Visit | Attending: Radiation Oncology | Admitting: Radiation Oncology

## 2024-10-12 DIAGNOSIS — R911 Solitary pulmonary nodule: Secondary | ICD-10-CM | POA: Insufficient documentation

## 2024-10-12 MED ORDER — IOHEXOL 350 MG/ML SOLN: 75.0000 mL | Freq: Once | INTRAVENOUS | Status: DC | PRN

## 2024-10-12 MED ORDER — IOHEXOL 300 MG/ML  SOLN
75.0000 mL | Freq: Once | INTRAMUSCULAR | Status: AC | PRN
  Administered 2024-10-12: 75 mL via INTRAVENOUS

## 2024-10-13 ENCOUNTER — Other Ambulatory Visit

## 2024-10-16 LAB — COLOGUARD: COLOGUARD: NEGATIVE

## 2024-10-26 ENCOUNTER — Other Ambulatory Visit: Payer: Self-pay | Admitting: *Deleted

## 2024-10-26 ENCOUNTER — Ambulatory Visit
Admission: RE | Admit: 2024-10-26 | Discharge: 2024-10-26 | Disposition: A | Discharge status: Home / Self Care | Source: Ambulatory Visit | Attending: Radiation Oncology | Admitting: Radiation Oncology

## 2024-10-26 ENCOUNTER — Encounter: Payer: Self-pay | Admitting: Radiation Oncology

## 2024-10-26 VITALS — BP 151/84 | HR 70 | Resp 17 | Ht 68.0 in | Wt 180.3 lb

## 2024-10-26 DIAGNOSIS — R911 Solitary pulmonary nodule: Secondary | ICD-10-CM

## 2024-10-26 DIAGNOSIS — C3411 Malignant neoplasm of upper lobe, right bronchus or lung: Secondary | ICD-10-CM | POA: Diagnosis present

## 2024-10-26 DIAGNOSIS — Z923 Personal history of irradiation: Secondary | ICD-10-CM | POA: Diagnosis not present

## 2024-10-26 NOTE — Progress Notes (Signed)
 Radiation Oncology Follow up Note  Name: Dominic Ferguson   Date:   10/26/2024 MRN:  [id number] DOB: 1961-10-05    This 64 y.o. male presents to the clinic today for 64 year old male now out 4 months having completed SBRT to his right upper lobe for stage I non-small cell lung cancer.  REFERRING PROVIDER: Epifanio Alm SQUIBB, MD  HPI: Patient is a 64 year old male now out 4 months having completed SBRT to his right upper lobe for stage I non-small cell lung cancer.  He is asymptomatic does have a mild nonproductive cough.  Specifically denies any change in pulmonary status any significant hemoptysis or chest tightness.  He had a recent CT scan of his chest which I have reviewed.  Showing interval decrease in the size of the treated anterior right upper lobe nodule.  No evidence of metastatic disease in the chest is noted.  COMPLICATIONS OF TREATMENT: none  FOLLOW UP COMPLIANCE: keeps appointments   PHYSICAL EXAM:  BP (!) 151/84   Pulse 70   Resp 17   Ht 5' 8 (1.727 m)   Wt 180 lb 4.8 oz (81.8 kg)   SpO2 96%   BMI 27.41 kg/m  Well-developed well-nourished patient in NAD. HEENT reveals PERLA, EOMI, discs not visualized.  Oral cavity is clear. No oral mucosal lesions are identified. Neck is clear without evidence of cervical or supraclavicular adenopathy. Lungs are clear to A&P. Cardiac examination is essentially unremarkable with regular rate and rhythm without murmur rub or thrill. Abdomen is benign with no organomegaly or masses noted. Motor sensory and DTR levels are equal and symmetric in the upper and lower extremities. Cranial nerves II through XII are grossly intact. Proprioception is intact. No peripheral adenopathy or edema is identified. No motor or sensory levels are noted. Crude visual fields are within normal range.  RADIOLOGY RESULTS: CT scan reviewed compatible with above-stated findings  PLAN: At the present time patient is doing well with good result by CT criteria to  his right upper lobe lesion.  I am pleased with his overall progress.  I have asked to see him back in 6 months for follow-up with a CT scan at that time.  Patient knows to call sooner with any concerns.  I would like to take this opportunity to thank you for allowing me to participate in the care of your patient.SABRA Marcey Penton, MD

## 2024-10-27 ENCOUNTER — Telehealth: Payer: Self-pay | Admitting: Radiation Oncology

## 2024-10-27 NOTE — Telephone Encounter (Signed)
 Called pt to sched CT - left vm for return call - [facility]

## 2024-10-29 ENCOUNTER — Encounter: Payer: Self-pay | Admitting: Radiation Oncology

## 2025-04-12 ENCOUNTER — Other Ambulatory Visit

## 2025-04-26 ENCOUNTER — Ambulatory Visit: Admitting: Radiation Oncology

## 2025-05-31 ENCOUNTER — Ambulatory Visit (INDEPENDENT_AMBULATORY_CARE_PROVIDER_SITE_OTHER): Admitting: Vascular Surgery

## 2025-05-31 ENCOUNTER — Encounter (INDEPENDENT_AMBULATORY_CARE_PROVIDER_SITE_OTHER)
